# Patient Record
Sex: Female | Born: 1955 | Race: White | Hispanic: No | Smoking: Current every day smoker
Health system: Southern US, Community
[De-identification: ages and names within clinical notes are randomized; demographics above are authoritative.]

## PROBLEM LIST (undated history)

## (undated) DIAGNOSIS — I10 Essential (primary) hypertension: Secondary | ICD-10-CM

## (undated) DIAGNOSIS — E7800 Pure hypercholesterolemia, unspecified: Secondary | ICD-10-CM

## (undated) HISTORY — PX: FRACTURE SURGERY: SHX138

---

## 2011-08-15 ENCOUNTER — Encounter: Payer: Self-pay | Admitting: *Deleted

## 2011-08-15 ENCOUNTER — Other Ambulatory Visit: Payer: Self-pay

## 2011-08-15 ENCOUNTER — Emergency Department (HOSPITAL_COMMUNITY)
Admission: EM | Admit: 2011-08-15 | Discharge: 2011-08-15 | Disposition: A | Discharge status: Home / Self Care | Payer: Self-pay | Attending: Emergency Medicine | Admitting: Emergency Medicine

## 2011-08-15 DIAGNOSIS — R51 Headache: Secondary | ICD-10-CM | POA: Insufficient documentation

## 2011-08-15 DIAGNOSIS — E878 Other disorders of electrolyte and fluid balance, not elsewhere classified: Secondary | ICD-10-CM | POA: Insufficient documentation

## 2011-08-15 DIAGNOSIS — F17200 Nicotine dependence, unspecified, uncomplicated: Secondary | ICD-10-CM | POA: Insufficient documentation

## 2011-08-15 DIAGNOSIS — I10 Essential (primary) hypertension: Secondary | ICD-10-CM | POA: Insufficient documentation

## 2011-08-15 DIAGNOSIS — E871 Hypo-osmolality and hyponatremia: Secondary | ICD-10-CM | POA: Insufficient documentation

## 2011-08-15 DIAGNOSIS — I498 Other specified cardiac arrhythmias: Secondary | ICD-10-CM | POA: Insufficient documentation

## 2011-08-15 LAB — BASIC METABOLIC PANEL
BUN: 5 mg/dL — ABNORMAL LOW (ref 6–23)
Chloride: 88 mEq/L — ABNORMAL LOW (ref 96–112)
GFR calc Af Amer: >60 mL/min (ref >60)
Glucose, Bld: 95 mg/dL (ref 70–99)
Potassium: 4 mEq/L (ref 3.5–5.1)

## 2011-08-15 LAB — URINALYSIS, ROUTINE W REFLEX MICROSCOPIC
Bilirubin Urine: NEGATIVE
Hgb urine dipstick: NEGATIVE
Specific Gravity, Urine: <1.005 — ABNORMAL LOW (ref 1.005–1.030)
pH: 6 (ref 5.0–8.0)

## 2011-08-15 MED ORDER — LISINOPRIL 10 MG PO TABS
10.0000 mg | ORAL_TABLET | Freq: Once | ORAL | Status: AC
  Administered 2011-08-15: 10 mg via ORAL
  Filled 2011-08-15: qty 1

## 2011-08-15 MED ORDER — LORAZEPAM 1 MG PO TABS
1.0000 mg | ORAL_TABLET | Freq: Once | ORAL | Status: AC
  Administered 2011-08-15: 1 mg via ORAL
  Filled 2011-08-15: qty 1

## 2011-08-15 MED ORDER — LISINOPRIL 10 MG PO TABS: 10.0000 mg | ORAL_TABLET | Freq: Every day | ORAL | Status: DC

## 2011-08-15 MED ORDER — SODIUM CHLORIDE 0.9 % IV BOLUS (SEPSIS)
1000.0000 mL | Freq: Once | INTRAVENOUS | Status: AC
  Administered 2011-08-15: 1000 mL via INTRAVENOUS

## 2011-08-15 NOTE — ED Notes (Signed)
Pt states she had a headache last night and has been under a lot of pressure; pt states she had a family member take her BP and it was 186/96, pt states she then took 1/2 of a xanax and her BP came down to 183/93

## 2011-08-15 NOTE — ED Provider Notes (Signed)
Scribed for Dayton Bailiff, MD, the patient was seen in room APA15/APA15. This chart was scribed by [employer]. The patient's care started at 15:49  CSN: [id number] Arrival date & time: 08/15/2011  3:48 PM  Chief Complaint  Patient presents with  . Hypertension   HPI Cassandra Salazar is a 55 y.o. female who presents to the Emergency Department complaining of Hypertension. Complains of being under a lot of pressure in life. Patient denies Chest pain, SOB, or visual changes but reports a headache. She denies ever being on blood pressure medicine. Per nurse notes, patient states having a BP of 186/96 that was alleviated to 183/93 after taking 1/2 of a xanax. Patient is nervous. Reports a alcohol and tobacco use. No PCP.  History reviewed. No pertinent past medical history.  History reviewed. No pertinent past surgical history.  History reviewed. No pertinent family history.  History  Substance Use Topics  . Smoking status: Current Everyday Smoker  . Smokeless tobacco: Not on file  . Alcohol Use: Yes     beer daily    OB History    Grav Para Term Preterm Abortions TAB SAB Ect Mult Living                  Review of Systems  Eyes: Negative for visual disturbance.  Cardiovascular: Negative for chest pain.  Neurological: Positive for headaches.  All other systems reviewed and are negative.    Allergies  Ampicillin  Home Medications   Current Outpatient Rx  Name Route Sig Dispense Refill  . ASPIRIN 325 MG PO TABS Oral Take 650 mg by mouth as needed. Headache pain     . LISINOPRIL 10 MG PO TABS Oral Take 1 tablet (10 mg total) by mouth daily. 30 tablet 0    BP 207/95  Pulse 105  Temp(Src) 98.7 F (37.1 C) (Oral)  Resp 20  Ht 5\' 5"  (1.651 m)  Wt 154 lb (69.854 kg)  BMI 25.63 kg/m2  SpO2 98%  Physical Exam  Nursing note and vitals reviewed. Constitutional: She is oriented to person, place, and time. She appears well-developed.       Awake, alert, nontoxic  appearance.  HENT:  Head: Atraumatic.  Eyes: Conjunctivae are normal. Pupils are equal, round, and reactive to light. Right eye exhibits no discharge. Left eye exhibits no discharge.  Neck: Neck supple. No tracheal deviation present.  Cardiovascular: Regular rhythm and normal heart sounds.        Patient was a little tachycardic because she was nervous upon examination  Pulmonary/Chest: Effort normal. No respiratory distress. She exhibits no tenderness.  Abdominal: Soft. Bowel sounds are normal. There is no tenderness. There is no rebound.  Musculoskeletal: Normal range of motion. She exhibits no tenderness.       Baseline ROM, no obvious new focal weakness.  Lymphadenopathy:    She has no cervical adenopathy.  Neurological: She is alert and oriented to person, place, and time. No cranial nerve deficit.       Mental status and motor strength appears baseline for patient and situation.  Skin: Skin is warm and dry. No rash noted. She is not diaphoretic.  Psychiatric: She has a normal mood and affect.       Patient is nervous    ED Course  Procedures   OTHER DATA REVIEWED: Nursing notes, vital signs, and past medical records reviewed.    DIAGNOSTIC STUDIES: Oxygen Saturation is 98% on room air, normal by my interpretation.  LABS / RADIOLOGY:  Results for orders placed during the hospital encounter of 08/15/11  URINALYSIS, ROUTINE W REFLEX MICROSCOPIC      Component Value Range   Color, Urine YELLOW  YELLOW    Appearance CLEAR  CLEAR    Specific Gravity, Urine <1.005 (*) 1.005 - 1.030    pH 6.0  5.0 - 8.0    Glucose, UA NEGATIVE  NEGATIVE (mg/dL)   Hgb urine dipstick NEGATIVE  NEGATIVE    Bilirubin Urine NEGATIVE  NEGATIVE    Ketones, ur 15 (*) NEGATIVE (mg/dL)   Protein, ur NEGATIVE  NEGATIVE (mg/dL)   Urobilinogen, UA 0.2  0.0 - 1.0 (mg/dL)   Nitrite NEGATIVE  NEGATIVE    Leukocytes, UA NEGATIVE  NEGATIVE   BASIC METABOLIC PANEL      Component Value Range   Sodium 127  (*) 135 - 145 (mEq/L)   Potassium 4.0  3.5 - 5.1 (mEq/L)   Chloride 88 (*) 96 - 112 (mEq/L)   CO2 26  19 - 32 (mEq/L)   Glucose, Bld 95  70 - 99 (mg/dL)   BUN 5 (*) 6 - 23 (mg/dL)   Creatinine, Ser 4.09  0.50 - 1.10 (mg/dL)   Calcium 9.6  8.4 - 81.1 (mg/dL)   GFR calc non Af Amer >60  >60 (mL/min)   GFR calc Af Amer >60  >60 (mL/min)     ED COURSE / COORDINATION OF CARE: 15:55 - EDMD examined patient. Patient is anxious and nervous. The following orders were placed Orders Placed This Encounter  Procedures  . Urinalysis with microscopic  . Basic metabolic panel  . EKG test     Date: 08/15/2011  Rate: 111  Rhythm: sinus tachycardia  QRS Axis: indeterminate  Intervals: normal  ST/T Wave abnormalities: ST depressions diffusely  Conduction Disutrbances:none  Narrative Interpretation: No ischemic change  Old EKG Reviewed: none available  MDM: Patient with asymptomatic hypertension. She has no previous treatment for hypertension. She is under a lot of stress. She has no chest pain, shortness of breath, vision changes, headache the time of arrival. She had mild headache last evening. I performed a screening labs. Her urine was relatively unremarkable her crit was normal. Incidental finding of hyponatremia and hypochloremia was identified. Since she is asymptomatic I administered a liter of IV fluids and encouraged her to continue hydration with electrolyte solutions at home. I do not feel this warrants admission at this time as she is asymptomatic. She received a dose of lisinopril in the emergency department her heart rate normalized. She has appointment for followup on October 15 and was encouraged to attend that appointment. I explained her that the emergency department is not the best place for chronic blood pressure management. I will start her on lisinopril.  IMPRESSION: Diagnoses that have been ruled out:  Diagnoses that are still under consideration:  Final diagnoses:    Hypertension  Hyponatremia  Hypochloremia    PLAN:  Home  The patient is to return the emergency department if there is any worsening of symptoms. I have reviewed the discharge instructions with the patient  CONDITION ON DISCHARGE: Stable  MEDICATIONS GIVEN IN THE E.D.  Medications  aspirin 325 MG tablet (not administered)  sodium chloride 0.9 % bolus 1,000 mL (1000 mL Intravenous Not Given 08/15/11 1739)  lisinopril (PRINIVIL,ZESTRIL) 10 MG tablet (not administered)  LORazepam (ATIVAN) tablet 1 mg (1 mg Oral Given 08/15/11 1606)  lisinopril (PRINIVIL,ZESTRIL) tablet 10 mg (10 mg Oral Given 08/15/11 1737)  DISCHARGE MEDICATIONS: New Prescriptions   LISINOPRIL (PRINIVIL,ZESTRIL) 10 MG TABLET    Take 1 tablet (10 mg total) by mouth daily.    SCRIBE ATTESTATION: I personally performed the services described in this documentation, which was scribed in my presence. The recorded information has been reviewed and considered.     Dayton Bailiff, MD 08/15/11 1800

## 2011-10-01 ENCOUNTER — Emergency Department (HOSPITAL_COMMUNITY): Payer: Self-pay

## 2011-10-01 ENCOUNTER — Other Ambulatory Visit: Payer: Self-pay

## 2011-10-01 ENCOUNTER — Inpatient Hospital Stay (HOSPITAL_COMMUNITY)
Admission: EM | Admit: 2011-10-01 | Discharge: 2011-10-02 | DRG: 101 | Disposition: A | Discharge status: Home / Self Care | Payer: Self-pay | Attending: Internal Medicine | Admitting: Internal Medicine

## 2011-10-01 ENCOUNTER — Encounter (HOSPITAL_COMMUNITY): Payer: Self-pay | Admitting: Emergency Medicine

## 2011-10-01 DIAGNOSIS — Z720 Tobacco use: Secondary | ICD-10-CM

## 2011-10-01 DIAGNOSIS — F17200 Nicotine dependence, unspecified, uncomplicated: Secondary | ICD-10-CM | POA: Diagnosis present

## 2011-10-01 DIAGNOSIS — R569 Unspecified convulsions: Principal | ICD-10-CM

## 2011-10-01 DIAGNOSIS — F1010 Alcohol abuse, uncomplicated: Secondary | ICD-10-CM | POA: Diagnosis present

## 2011-10-01 DIAGNOSIS — I10 Essential (primary) hypertension: Secondary | ICD-10-CM

## 2011-10-01 DIAGNOSIS — E876 Hypokalemia: Secondary | ICD-10-CM

## 2011-10-01 DIAGNOSIS — E871 Hypo-osmolality and hyponatremia: Secondary | ICD-10-CM | POA: Diagnosis present

## 2011-10-01 DIAGNOSIS — R55 Syncope and collapse: Secondary | ICD-10-CM

## 2011-10-01 HISTORY — DX: Essential (primary) hypertension: I10

## 2011-10-01 LAB — DIFFERENTIAL
Eosinophils Absolute: 0 10*3/uL (ref 0.0–0.7)
Lymphocytes Relative: 21 % (ref 12–46)
Lymphs Abs: 2.1 10*3/uL (ref 0.7–4.0)
Monocytes Relative: 8 % (ref 3–12)
Neutrophils Relative %: 70 % (ref 43–77)

## 2011-10-01 LAB — COMPREHENSIVE METABOLIC PANEL
Alkaline Phosphatase: 103 U/L (ref 39–117)
BUN: 3 mg/dL — ABNORMAL LOW (ref 6–23)
CO2: 30 mEq/L (ref 19–32)
Chloride: 82 mEq/L — ABNORMAL LOW (ref 96–112)
GFR calc Af Amer: >90 mL/min (ref >90)
GFR calc non Af Amer: >90 mL/min (ref >90)
Glucose, Bld: 120 mg/dL — ABNORMAL HIGH (ref 70–99)
Potassium: 2.9 mEq/L — ABNORMAL LOW (ref 3.5–5.1)
Total Bilirubin: 0.4 mg/dL (ref 0.3–1.2)
Total Protein: 7.3 g/dL (ref 6.0–8.3)

## 2011-10-01 LAB — URINALYSIS, ROUTINE W REFLEX MICROSCOPIC
Bilirubin Urine: NEGATIVE
Hgb urine dipstick: NEGATIVE
Nitrite: NEGATIVE
Specific Gravity, Urine: 1.015 (ref 1.005–1.030)
pH: 6 (ref 5.0–8.0)

## 2011-10-01 LAB — ETHANOL: Alcohol, Ethyl (B): <11 mg/dL (ref 0–11)

## 2011-10-01 LAB — CBC
Hemoglobin: 14.2 g/dL (ref 12.0–15.0)
MCH: 32.7 pg (ref 26.0–34.0)
RBC: 4.34 MIL/uL (ref 3.87–5.11)

## 2011-10-01 MED ORDER — ASPIRIN EC 81 MG PO TBEC
81.0000 mg | DELAYED_RELEASE_TABLET | Freq: Every day | ORAL | Status: DC
  Administered 2011-10-02: 81 mg via ORAL
  Filled 2011-10-01: qty 1

## 2011-10-01 MED ORDER — VITAMIN B-1 100 MG PO TABS
100.0000 mg | ORAL_TABLET | Freq: Every day | ORAL | Status: DC
  Administered 2011-10-02: 100 mg via ORAL
  Filled 2011-10-01: qty 1

## 2011-10-01 MED ORDER — HYDROMORPHONE HCL PF 1 MG/ML IJ SOLN: 1.0000 mg | INTRAMUSCULAR | Status: DC | PRN

## 2011-10-01 MED ORDER — ONDANSETRON HCL 4 MG PO TABS: 4.0000 mg | ORAL_TABLET | Freq: Four times a day (QID) | ORAL | Status: DC | PRN

## 2011-10-01 MED ORDER — FLEET ENEMA 7-19 GM/118ML RE ENEM: 1.0000 | ENEMA | RECTAL | Status: DC | PRN

## 2011-10-01 MED ORDER — POTASSIUM CHLORIDE IN NACL 20-0.9 MEQ/L-% IV SOLN
INTRAVENOUS | Status: DC
  Administered 2011-10-02: 01:00:00 via INTRAVENOUS

## 2011-10-01 MED ORDER — ENOXAPARIN SODIUM 40 MG/0.4ML ~~LOC~~ SOLN
40.0000 mg | Freq: Every day | SUBCUTANEOUS | Status: DC
  Administered 2011-10-02: 40 mg via SUBCUTANEOUS
  Filled 2011-10-01: qty 0.4

## 2011-10-01 MED ORDER — FOLIC ACID 1 MG PO TABS
1.0000 mg | ORAL_TABLET | Freq: Every day | ORAL | Status: DC
  Administered 2011-10-02: 1 mg via ORAL
  Filled 2011-10-01: qty 1

## 2011-10-01 MED ORDER — HYDROMORPHONE HCL PF 1 MG/ML IJ SOLN
0.5000 mg | INTRAMUSCULAR | Status: DC | PRN
  Filled 2011-10-01: qty 1

## 2011-10-01 MED ORDER — TRAZODONE HCL 50 MG PO TABS: 25.0000 mg | ORAL_TABLET | Freq: Every evening | ORAL | Status: DC | PRN

## 2011-10-01 MED ORDER — LORAZEPAM 2 MG/ML IJ SOLN: 0.0000 mg | Freq: Two times a day (BID) | INTRAMUSCULAR | Status: DC

## 2011-10-01 MED ORDER — LORAZEPAM 1 MG PO TABS: 1.0000 mg | ORAL_TABLET | Freq: Four times a day (QID) | ORAL | Status: DC | PRN

## 2011-10-01 MED ORDER — BISACODYL 10 MG RE SUPP: 10.0000 mg | RECTAL | Status: DC | PRN

## 2011-10-01 MED ORDER — ONDANSETRON HCL 4 MG/2ML IJ SOLN: 4.0000 mg | Freq: Four times a day (QID) | INTRAMUSCULAR | Status: DC | PRN

## 2011-10-01 MED ORDER — SODIUM CHLORIDE 0.9 % IV SOLN
INTRAVENOUS | Status: AC
  Administered 2011-10-01: 20:00:00 via INTRAVENOUS

## 2011-10-01 MED ORDER — THIAMINE HCL 100 MG/ML IJ SOLN
Freq: Once | INTRAVENOUS | Status: AC
  Administered 2011-10-02: 01:00:00 via INTRAVENOUS
  Filled 2011-10-01: qty 1000

## 2011-10-01 MED ORDER — SODIUM CHLORIDE 0.9 % IV SOLN
999.0000 mL | Freq: Once | INTRAVENOUS | Status: AC
  Administered 2011-10-01: 1000 mL via INTRAVENOUS

## 2011-10-01 MED ORDER — ONDANSETRON HCL 4 MG/2ML IJ SOLN: 4.0000 mg | Freq: Three times a day (TID) | INTRAMUSCULAR | Status: DC | PRN

## 2011-10-01 MED ORDER — LORAZEPAM 2 MG/ML IJ SOLN: 1.0000 mg | Freq: Four times a day (QID) | INTRAMUSCULAR | Status: DC | PRN

## 2011-10-01 MED ORDER — DOCUSATE SODIUM 100 MG PO CAPS: 100.0000 mg | ORAL_CAPSULE | Freq: Two times a day (BID) | ORAL | Status: DC | PRN

## 2011-10-01 MED ORDER — ACETAMINOPHEN 325 MG PO TABS: 650.0000 mg | ORAL_TABLET | Freq: Four times a day (QID) | ORAL | Status: DC | PRN

## 2011-10-01 MED ORDER — THERA M PLUS PO TABS
1.0000 | ORAL_TABLET | Freq: Every day | ORAL | Status: DC
  Administered 2011-10-02: 1 via ORAL
  Filled 2011-10-01: qty 1

## 2011-10-01 MED ORDER — ACETAMINOPHEN 650 MG RE SUPP: 650.0000 mg | Freq: Four times a day (QID) | RECTAL | Status: DC | PRN

## 2011-10-01 MED ORDER — POTASSIUM CHLORIDE 10 MEQ/100ML IV SOLN
10.0000 meq | INTRAVENOUS | Status: AC
  Administered 2011-10-01 (×2): 10 meq via INTRAVENOUS
  Filled 2011-10-01 (×2): qty 100

## 2011-10-01 MED ORDER — THIAMINE HCL 100 MG/ML IJ SOLN: 100.0000 mg | Freq: Every day | INTRAMUSCULAR | Status: DC

## 2011-10-01 MED ORDER — LORAZEPAM 2 MG/ML IJ SOLN: 0.0000 mg | Freq: Four times a day (QID) | INTRAMUSCULAR | Status: DC

## 2011-10-01 MED ORDER — NICOTINE 21 MG/24HR TD PT24
21.0000 mg | MEDICATED_PATCH | Freq: Every day | TRANSDERMAL | Status: DC
  Administered 2011-10-02: 21 mg via TRANSDERMAL
  Filled 2011-10-01: qty 1

## 2011-10-01 NOTE — ED Notes (Signed)
Patient arrives via EMS from home with c/o syncopal episode with seizure like activity per family. Family describes activity as full body and lasting for several minutes. Patient was post-ictal upon EMS arrival. Patient not sure what happened, but reports she got lightheaded and nauseous before she passed out.

## 2011-10-01 NOTE — ED Provider Notes (Addendum)
History     CSN: [id number] Arrival date & time: 10/01/2011  4:26 PM   First MD Initiated Contact with Patient 10/01/11 1629      Chief Complaint  Patient presents with  . Loss of Consciousness    (Consider location/radiation/quality/duration/timing/severity/associated sxs/prior treatment) HPI The patient presents immediately following an episode of altered consciousness. Per report, the patient lost consciousness, and was shaking for several moments, before slowly gaining consciousness. The patient herself notes that she had a prodromal sensation of lightheadedness, then a period of amnesia, and she recalls awakening with slight disorientation. On EGD presentation, the patient is asymptomatic, he notes that prior to this episode she had been in her usual state of health. No recent health changes, but she notes increased home stress, the passing of her father-in-law. She also notes that she switched from lisinopril to atenolol-chlorthalidone approximately one month ago. No fever, no chills, no nausea, no current disorientation, no pain, no dyspnea, no chest pain Past Medical History  Diagnosis Date  . Hypertension     History reviewed. No pertinent past surgical history.  No family history on file.  History  Substance Use Topics  . Smoking status: Current Everyday Smoker -- 1.0 packs/day  . Smokeless tobacco: Not on file  . Alcohol Use: Yes     beer daily    OB History    Grav Para Term Preterm Abortions TAB SAB Ect Mult Living                  Review of Systems  Constitutional: Negative.   HENT: Negative.   Eyes: Negative.   Respiratory: Negative.   Cardiovascular: Negative.   Gastrointestinal: Negative.   Genitourinary: Negative.   Musculoskeletal: Negative.   Neurological:       1 prior, similar episode though less pronounced approximately one month ago.  Hematological: Negative.   Psychiatric/Behavioral: Negative.     Allergies  Ampicillin and Mango  flavor  Home Medications   Current Outpatient Rx  Name Route Sig Dispense Refill  . ASPIRIN 325 MG PO TABS Oral Take 650 mg by mouth as needed. Headache pain     . ATENOLOL-CHLORTHALIDONE 50-25 MG PO TABS Oral Take 1 tablet by mouth daily.        BP 156/78  Pulse 68  Temp(Src) 98.6 F (37 C) (Oral)  Resp 18  Ht 5\' 3"  (1.6 m)  Wt 154 lb (69.854 kg)  BMI 27.28 kg/m2  SpO2 96%  Physical Exam  Nursing note and vitals reviewed. Constitutional: She is oriented to person, place, and time. No distress.  HENT:  Head: Normocephalic and atraumatic.  Eyes: Conjunctivae and EOM are normal.  Cardiovascular: Normal rate and regular rhythm.   Pulmonary/Chest: Effort normal. No stridor. No respiratory distress.  Abdominal: She exhibits no distension.  Musculoskeletal: She exhibits no edema.  Neurological: She is alert and oriented to person, place, and time. No cranial nerve deficit.  Skin: Skin is warm and dry.  Psychiatric: She has a normal mood and affect.    ED Course  Procedures (including critical care time)  Labs Reviewed  COMPREHENSIVE METABOLIC PANEL - Abnormal; Notable for the following:    Sodium 120 (*)    Potassium 2.9 (*)    Chloride 82 (*)    Glucose, Bld 120 (*)    BUN 3 (*)    All other components within normal limits  URINALYSIS, ROUTINE W REFLEX MICROSCOPIC  CBC  DIFFERENTIAL   Dg Chest 2 View  10/01/2011  *  RADIOLOGY REPORT*  Clinical Data: Syncope.  Possible seizure.  Hypertension.  Smoking history.  CHEST - 2 VIEW  Comparison: None.  Findings: Artifact overlies the chest.  Heart size is normal. There is calcification of the thoracic aorta.  The lung shows slightly prominent interstitial markings there are likely chronic. No evidence of active infiltrate, mass, effusion or collapse. There are compression fractures of two adjacent mid thoracic vertebral bodies with loss of height of about 50%.  The age is indeterminate.  IMPRESSION: No active cardiopulmonary  disease.  Chronic appearing interstitial lung markings.  Two adjacent mid thoracic compression fractures, age indeterminate.  Original Report Authenticated By: Thomasenia Sales, M.D.   Ct Head Wo Contrast  10/01/2011  *RADIOLOGY REPORT*  Clinical Data: Syncopal episode.  Possible seizure.  CT HEAD WITHOUT CONTRAST  Technique:  Contiguous axial images were obtained from the base of the skull through the vertex without contrast.  Comparison: None.  Findings: The brain has a normal appearance without evidence of malformation, atrophy, old or acute infarction, mass lesion, hemorrhage, hydrocephalus or extra-axial collection. There are mild chronic small vessel changes affecting the hemispheric deep white matter.  There is either a small old lacunar infarction or a dilated perivascular space at the base of the brain on the right. There is atherosclerotic calcification of the major vessels at the base of the brain.  No skull fracture.  Sinuses are clear.  IMPRESSION: No acute finding.  Mild chronic small vessel disease of the deep white matter.  Atherosclerotic calcification of the major vessels at the base the brain.  Original Report Authenticated By: Thomasenia Sales, M.D.     No diagnosis found.   Date: 10/01/2011  Rate:80  Rhythm: normal sinus rhythm  QRS Axis: normal  Intervals: normal  ST/T Wave abnormalities: nonspecific ST changes and nonspecific T wave changes  Conduction Disutrbances:incomplete RBBB  Narrative Interpretation:   Old EKG Reviewed: unchanged    MDM  This generally well-appearing 55 year old female now presents following a seizure versus syncope episode. On exam the patient is in no distress, has no notable physical exam findings. The patient's laboratory evaluation is notable for hyponatremia, hypokalemia.  CT and x-rays do not demonstrate acute changes.  Given the patient's recent switch from lisinopril to atenolol-chlorthalidone, medication effects are a possibility. Following  presentation the patient received IV fluids, and these will be continued. The patient will be admitted to the medicine service, under the care of Dr. Orvan Falconer.        Gerhard Munch, MD 10/01/11 1920  Gerhard Munch, MD 10/01/11 1943  Gerhard Munch, MD 11/26/11 [id number]

## 2011-10-01 NOTE — ED Notes (Signed)
Michaele Amundson ( husband) [phone] cell

## 2011-10-01 NOTE — H&P (Signed)
PCP:   No primary provider on file.   Chief Complaint:  Syncope this afternoon  HPI: Cassandra Salazar is an 55 y.o. Caucasian female.   Recently relocated to the  [place] area from [place] 2 months ago. Other than hypertension during pregnancy has had normal blood pressures, but recently felt her blood pressure was elevated had it checked by a relative and then came to the emergency room in September and was started on lisinopril. 2-3 weeks ago she visited the public health Department and the lisinopril was replaced by an atenolol 50 mg/ chlorthalidone 25 mg combination. Additionally, patient self reports drinking 6-8 beers daily for the past 30 years.  Patient self-reports a syncopal episode in her bathtub 1-2 months ago the details surrounding this are unclear; she woke up with a busted lip; is unclear how long she was unconscious for; he was alone at home.   Today patient had an episode early in the morning while sitting at her computer feeling dizzy and weird, she got up from her computer and laid down and the feeling eventually pass. Later in the afternoon with her husband present she was watching television and her husband reports that she suddenly shouted out assumed a stiffened extended posture been off her body began to jerk and she was foaming at the mouth for about 5 minutes. She then became unconscious for about 10 minutes, until the ambulance arrived and took her away. Patient remembers watching television, remembers feeling somewhat strange, but doesn't remember anything after that until she came around, saw EMS personnel, and was disoriented in time and place. She did not have any tongue biting she was not incontinent of urine. She has no known previous history of seizures she has no family history of seizures. He has an extensive family history of alcohol addiction.  She denies fever headache or neck stiffness.  Past Medical History  Diagnosis Date  . Hypertension      History reviewed. No pertinent past surgical history.  Medications:  HOME MEDS: Prior to Admission medications   Medication Sig Start Date End Date Taking? Authorizing Provider  aspirin 325 MG tablet Take 650 mg by mouth as needed. Headache pain    Yes Historical Provider, MD  atenolol-chlorthalidone (TENORETIC) 50-25 MG per tablet Take 1 tablet by mouth daily.     Yes Historical Provider, MD     Allergies:  Allergies  Allergen Reactions  . Ampicillin Rash  . Mango Flavor Rash    Social History:   reports that she has been smoking.  She does not have any smokeless tobacco history on file. She reports that she drinks about 20 - 25 ounces of alcohol per week. She reports that she does not use illicit drugs.  Family History: Family History  Problem Relation Age of Onset  . Hypertension Father   . Hypertension Brother   . Diabetes Mother   . Kidney failure Mother     ESRD from DM  . Diabetes Brother   . Diabetes Brother    alcohol abuse in her father and 4 brothers  Rewiew of Systems:  The patient denies anorexia, fever, weight loss,, vision loss, decreased hearing, hoarseness, chest pain, dyspnea on exertion, peripheral edema, balance deficits, hemoptysis, abdominal pain, melena, hematochezia, severe indigestion/heartburn, hematuria, incontinence, genital sores, muscle weakness, suspicious skin lesions, transient blindness, difficulty walking, depression, unusual weight change, abnormal bleeding, enlarged lymph nodes, angioedema, and breast masses.  Physical Exam: Filed Vitals:   10/01/11 1842 10/01/11 1843 10/01/11 1920  10/01/11 2038  BP: 156/78 156/78 183/87 129/69  Pulse: 69 68 71 70  Temp:      TempSrc:      Resp: 18 18 16 16   Height:      Weight:      SpO2: 96% 96% 96% 96%   Blood pressure 129/69, pulse 70, temperature 98.6 F (37 C), temperature source Oral, resp. rate 16, height 5\' 3"  (1.6 m), weight 69.854 kg (154 lb), SpO2 96.00%.  GEN: Pleasant  middle-aged lady in lying in the stretcher in no acute distress; cooperative with exam PSYCH: alert and oriented x4; does not appear anxious does not appear depressed; affect is normal HEENT: Mucous membranes pink and anicteric; PERRLA; EOM intact; no cervical lymphadenopathy nor thyromegaly or carotid bruit; no JVD; Breasts:: Not examined CHEST WALL: No tenderness CHEST: Normal respiration, clear to auscultation bilaterally HEART: Regular rate and rhythm; no murmurs rubs or gallops BACK: No kyphosis or scoliosis; no CVA tenderness ABDOMEN: Obese, soft non-tender; no masses, no organomegaly, normal abdominal bowel sounds; no pannus; no intertriginous candida. Rectal Exam: Not done EXTREMITIES: age-appropriate arthropathy of the hands and knees; no edema; no ulcerations. Genitalia: not examined PULSES: 2+ and symmetric SKIN: Normal hydration no rash or ulceration CNS: Cranial nerves 2-12 grossly intact no focal neurologic deficit   Labs & Imaging Results for orders placed during the hospital encounter of 10/01/11 (from the past 48 hour(s))  URINALYSIS, ROUTINE W REFLEX MICROSCOPIC     Status: Normal   Collection Time   10/01/11  4:48 PM      Component Value Range Comment   Color, Urine YELLOW  YELLOW     Appearance CLEAR  CLEAR     Specific Gravity, Urine 1.015  1.005 - 1.030     pH 6.0  5.0 - 8.0     Glucose, UA NEGATIVE  NEGATIVE (mg/dL)    Hgb urine dipstick NEGATIVE  NEGATIVE     Bilirubin Urine NEGATIVE  NEGATIVE     Ketones, ur NEGATIVE  NEGATIVE (mg/dL)    Protein, ur NEGATIVE  NEGATIVE (mg/dL)    Urobilinogen, UA 0.2  0.0 - 1.0 (mg/dL)    Nitrite NEGATIVE  NEGATIVE     Leukocytes, UA NEGATIVE  NEGATIVE  MICROSCOPIC NOT DONE ON URINES WITH NEGATIVE PROTEIN, BLOOD, LEUKOCYTES, NITRITE, OR GLUCOSE <1000 mg/dL.  CBC     Status: Normal   Collection Time   10/01/11  5:18 PM      Component Value Range Comment   WBC 9.8  4.0 - 10.5 (K/uL)    RBC 4.34  3.87 - 5.11 (MIL/uL)     Hemoglobin 14.2  12.0 - 15.0 (g/dL)    HCT 40.9  81.1 - 91.4 (%)    MCV 92.4  78.0 - 100.0 (fL)    MCH 32.7  26.0 - 34.0 (pg)    MCHC 35.4  30.0 - 36.0 (g/dL)    RDW 78.2  95.6 - 21.3 (%)    Platelets 228  150 - 400 (K/uL)   DIFFERENTIAL     Status: Normal   Collection Time   10/01/11  5:18 PM      Component Value Range Comment   Neutrophils Relative 70  43 - 77 (%)    Neutro Abs 6.9  1.7 - 7.7 (K/uL)    Lymphocytes Relative 21  12 - 46 (%)    Lymphs Abs 2.1  0.7 - 4.0 (K/uL)    Monocytes Relative 8  3 - 12 (%)  Monocytes Absolute 0.8  0.1 - 1.0 (K/uL)    Eosinophils Relative 0  0 - 5 (%)    Eosinophils Absolute 0.0  0.0 - 0.7 (K/uL)    Basophils Relative 0  0 - 1 (%)    Basophils Absolute 0.0  0.0 - 0.1 (K/uL)   COMPREHENSIVE METABOLIC PANEL     Status: Abnormal   Collection Time   10/01/11  5:18 PM      Component Value Range Comment   Sodium 120 (*) 135 - 145 (mEq/L)    Potassium 2.9 (*) 3.5 - 5.1 (mEq/L)    Chloride 82 (*) 96 - 112 (mEq/L)    CO2 30  19 - 32 (mEq/L)    Glucose, Bld 120 (*) 70 - 99 (mg/dL)    BUN 3 (*) 6 - 23 (mg/dL)    Creatinine, Ser 1.61  0.50 - 1.10 (mg/dL)    Calcium 9.6  8.4 - 10.5 (mg/dL)    Total Protein 7.3  6.0 - 8.3 (g/dL)    Albumin 3.9  3.5 - 5.2 (g/dL)    AST 22  0 - 37 (U/L)    ALT 20  0 - 35 (U/L)    Alkaline Phosphatase 103  39 - 117 (U/L)    Total Bilirubin 0.4  0.3 - 1.2 (mg/dL)    GFR calc non Af Amer >90  >90 (mL/min)    GFR calc Af Amer >90  >90 (mL/min)    Dg Chest 2 View  10/01/2011  *RADIOLOGY REPORT*  Clinical Data: Syncope.  Possible seizure.  Hypertension.  Smoking history.  CHEST - 2 VIEW  Comparison: None.  Findings: Artifact overlies the chest.  Heart size is normal. There is calcification of the thoracic aorta.  The lung shows slightly prominent interstitial markings there are likely chronic. No evidence of active infiltrate, mass, effusion or collapse. There are compression fractures of two adjacent mid thoracic  vertebral bodies with loss of height of about 50%.  The age is indeterminate.  IMPRESSION: No active cardiopulmonary disease.  Chronic appearing interstitial lung markings.  Two adjacent mid thoracic compression fractures, age indeterminate.  Original Report Authenticated By: Thomasenia Sales, M.D.   Ct Head Wo Contrast  10/01/2011  *RADIOLOGY REPORT*  Clinical Data: Syncopal episode.  Possible seizure.  CT HEAD WITHOUT CONTRAST  Technique:  Contiguous axial images were obtained from the base of the skull through the vertex without contrast.  Comparison: None.  Findings: The brain has a normal appearance without evidence of malformation, atrophy, old or acute infarction, mass lesion, hemorrhage, hydrocephalus or extra-axial collection. There are mild chronic small vessel changes affecting the hemispheric deep white matter.  There is either a small old lacunar infarction or a dilated perivascular space at the base of the brain on the right. There is atherosclerotic calcification of the major vessels at the base of the brain.  No skull fracture.  Sinuses are clear.  IMPRESSION: No acute finding.  Mild chronic small vessel disease of the deep white matter.  Atherosclerotic calcification of the major vessels at the base the brain.  Original Report Authenticated By: Thomasenia Sales, M.D.      Assessment Present on Admission:  .Syncope .Seizure .Hyponatremia .Hypokalemia .HTN (hypertension), benign .Tobacco abuse .Alcohol abuse   Discussion/PLAN: This is likely a seizure disorder triggered by metabolic derangements especially hyponatremia, and to some extent hypokalemia. A metabolic derangements it is likely a result of chlorthalidone use, aggravated by alcohol abuse. We'll keep on seizure precautions  but will not start antiseizure medication;  since her mental status is normal we'll hydrate with normal saline and monitor her serum sodium. Will discontinue chlorthalidone./ We'll replete  potassium Counsel on tobacco and alcohol cessation We'll monitor her blood pressure in hospital, and restart atenolol as necessary.   Other plans as per orders.  Laporshia Hogen 10/01/2011, 8:42 PM

## 2011-10-01 NOTE — Plan of Care (Signed)
Problem: Consults Goal: General Medical Patient Education See Patient Education Module for specific education. Outcome: Progressing Monitoring low potassium levels, neuro checks as appropriate.  Patient A&Ox3, no S&S of seizure activity  Problem: Phase I Progression Outcomes Goal: OOB as tolerated unless otherwise ordered Outcome: Completed/Met Date Met:  10/01/11 Patient steady ambulating from stretcher to bed Goal: Initial discharge plan identified Outcome: Completed/Met Date Met:  10/01/11 To home lives with spouse Goal: Hemodynamically stable Outcome: Progressing Patient on BP med, new med for the last 2 weeks

## 2011-10-01 NOTE — ED Notes (Signed)
Water provided to patient per request with Dr Jeraldine Loots permission.

## 2011-10-01 NOTE — ED Notes (Signed)
Attempted to call report, was told Elease Hashimoto would have to call me back.

## 2011-10-02 LAB — CBC
MCV: 93.2 fL (ref 78.0–100.0)
Platelets: 211 10*3/uL (ref 150–400)
RBC: 3.83 MIL/uL — ABNORMAL LOW (ref 3.87–5.11)
RDW: 11.8 % (ref 11.5–15.5)
WBC: 8 10*3/uL (ref 4.0–10.5)

## 2011-10-02 LAB — BASIC METABOLIC PANEL
CO2: 28 mEq/L (ref 19–32)
Calcium: 8.9 mg/dL (ref 8.4–10.5)
Chloride: 92 mEq/L — ABNORMAL LOW (ref 96–112)
GFR calc Af Amer: >90 mL/min (ref >90)
Sodium: 127 mEq/L — ABNORMAL LOW (ref 135–145)

## 2011-10-02 LAB — MAGNESIUM: Magnesium: 2.2 mg/dL (ref 1.5–2.5)

## 2011-10-02 MED ORDER — AMLODIPINE BESYLATE 5 MG PO TABS: 5.0000 mg | ORAL_TABLET | Freq: Every day | ORAL | Status: DC

## 2011-10-02 MED ORDER — POTASSIUM CHLORIDE CRYS ER 20 MEQ PO TBCR: 40.0000 meq | EXTENDED_RELEASE_TABLET | Freq: Once | ORAL | Status: DC

## 2011-10-02 MED ORDER — M.V.I. ADULT IV INJ
INJECTION | INTRAVENOUS | Status: AC
  Filled 2011-10-02: qty 10

## 2011-10-02 MED ORDER — NICOTINE 21 MG/24HR TD PT24: 21.0000 | MEDICATED_PATCH | Freq: Every day | TRANSDERMAL | Status: AC

## 2011-10-02 MED ORDER — FOLIC ACID 5 MG/ML IJ SOLN
INTRAMUSCULAR | Status: AC
  Filled 2011-10-02: qty 0.2

## 2011-10-02 MED ORDER — POTASSIUM CHLORIDE CRYS ER 20 MEQ PO TBCR
40.0000 meq | EXTENDED_RELEASE_TABLET | ORAL | Status: DC
  Filled 2011-10-02: qty 1

## 2011-10-02 MED ORDER — THIAMINE HCL 100 MG/ML IJ SOLN
INTRAMUSCULAR | Status: AC
  Filled 2011-10-02: qty 2

## 2011-10-02 NOTE — Discharge Summary (Signed)
Physician Discharge Summary  Patient ID: Cassandra Salazar MRN: [id number] DOB/AGE: 1956/02/29 55 y.o.  Admit date: 10/01/2011 Discharge date: 10/02/2011    Discharge Diagnoses:  1. Seizure, related to a combination of hyponatremia and alcoholism. 2. Hyponatremia. 3. Hypertension. 4. Hypokalemia, secondary to medications. 5. Tobacco abuse. 6. Alcohol abuse.   Current Discharge Medication List    START taking these medications   Details  amLODipine (NORVASC) 5 MG tablet Take 1 tablet (5 mg total) by mouth daily. Qty: 30 tablet, Refills: 0    nicotine (NICODERM CQ - DOSED IN MG/24 HOURS) 21 mg/24hr patch Place 21 patches onto the skin daily. Qty: 28 patch, Refills: 0      CONTINUE these medications which have NOT CHANGED   Details  aspirin 325 MG tablet Take 650 mg by mouth as needed. Headache pain       STOP taking these medications     atenolol-chlorthalidone (TENORETIC) 50-25 MG per tablet         Discharged Condition: Stable and improved.    Consults: None.  Significant Diagnostic Studies: Dg Chest 2 View  10/01/2011  *RADIOLOGY REPORT*  Clinical Data: Syncope.  Possible seizure.  Hypertension.  Smoking history.  CHEST - 2 VIEW  Comparison: None.  Findings: Artifact overlies the chest.  Heart size is normal. There is calcification of the thoracic aorta.  The lung shows slightly prominent interstitial markings there are likely chronic. No evidence of active infiltrate, mass, effusion or collapse. There are compression fractures of two adjacent mid thoracic vertebral bodies with loss of height of about 50%.  The age is indeterminate.  IMPRESSION: No active cardiopulmonary disease.  Chronic appearing interstitial lung markings.  Two adjacent mid thoracic compression fractures, age indeterminate.  Original Report Authenticated By: Thomasenia Sales, M.D.   Ct Head Wo Contrast  10/01/2011  *RADIOLOGY REPORT*  Clinical Data: Syncopal episode.  Possible seizure.  CT HEAD  WITHOUT CONTRAST  Technique:  Contiguous axial images were obtained from the base of the skull through the vertex without contrast.  Comparison: None.  Findings: The brain has a normal appearance without evidence of malformation, atrophy, old or acute infarction, mass lesion, hemorrhage, hydrocephalus or extra-axial collection. There are mild chronic small vessel changes affecting the hemispheric deep white matter.  There is either a small old lacunar infarction or a dilated perivascular space at the base of the brain on the right. There is atherosclerotic calcification of the major vessels at the base of the brain.  No skull fracture.  Sinuses are clear.  IMPRESSION: No acute finding.  Mild chronic small vessel disease of the deep white matter.  Atherosclerotic calcification of the major vessels at the base the brain.  Original Report Authenticated By: Thomasenia Sales, M.D.    Lab Results: Results for orders placed during the hospital encounter of 10/01/11 (from the past 48 hour(s))  URINALYSIS, ROUTINE W REFLEX MICROSCOPIC     Status: Normal   Collection Time   10/01/11  4:48 PM      Component Value Range Comment   Color, Urine YELLOW  YELLOW     Appearance CLEAR  CLEAR     Specific Gravity, Urine 1.015  1.005 - 1.030     pH 6.0  5.0 - 8.0     Glucose, UA NEGATIVE  NEGATIVE (mg/dL)    Hgb urine dipstick NEGATIVE  NEGATIVE     Bilirubin Urine NEGATIVE  NEGATIVE     Ketones, ur NEGATIVE  NEGATIVE (mg/dL)  Protein, ur NEGATIVE  NEGATIVE (mg/dL)    Urobilinogen, UA 0.2  0.0 - 1.0 (mg/dL)    Nitrite NEGATIVE  NEGATIVE     Leukocytes, UA NEGATIVE  NEGATIVE  MICROSCOPIC NOT DONE ON URINES WITH NEGATIVE PROTEIN, BLOOD, LEUKOCYTES, NITRITE, OR GLUCOSE <1000 mg/dL.  CBC     Status: Normal   Collection Time   10/01/11  5:18 PM      Component Value Range Comment   WBC 9.8  4.0 - 10.5 (K/uL)    RBC 4.34  3.87 - 5.11 (MIL/uL)    Hemoglobin 14.2  12.0 - 15.0 (g/dL)    HCT 16.1  09.6 - 04.5 (%)     MCV 92.4  78.0 - 100.0 (fL)    MCH 32.7  26.0 - 34.0 (pg)    MCHC 35.4  30.0 - 36.0 (g/dL)    RDW 40.9  81.1 - 91.4 (%)    Platelets 228  150 - 400 (K/uL)   DIFFERENTIAL     Status: Normal   Collection Time   10/01/11  5:18 PM      Component Value Range Comment   Neutrophils Relative 70  43 - 77 (%)    Neutro Abs 6.9  1.7 - 7.7 (K/uL)    Lymphocytes Relative 21  12 - 46 (%)    Lymphs Abs 2.1  0.7 - 4.0 (K/uL)    Monocytes Relative 8  3 - 12 (%)    Monocytes Absolute 0.8  0.1 - 1.0 (K/uL)    Eosinophils Relative 0  0 - 5 (%)    Eosinophils Absolute 0.0  0.0 - 0.7 (K/uL)    Basophils Relative 0  0 - 1 (%)    Basophils Absolute 0.0  0.0 - 0.1 (K/uL)   COMPREHENSIVE METABOLIC PANEL     Status: Abnormal   Collection Time   10/01/11  5:18 PM      Component Value Range Comment   Sodium 120 (*) 135 - 145 (mEq/L)    Potassium 2.9 (*) 3.5 - 5.1 (mEq/L)    Chloride 82 (*) 96 - 112 (mEq/L)    CO2 30  19 - 32 (mEq/L)    Glucose, Bld 120 (*) 70 - 99 (mg/dL)    BUN 3 (*) 6 - 23 (mg/dL)    Creatinine, Ser 7.82  0.50 - 1.10 (mg/dL)    Calcium 9.6  8.4 - 10.5 (mg/dL)    Total Protein 7.3  6.0 - 8.3 (g/dL)    Albumin 3.9  3.5 - 5.2 (g/dL)    AST 22  0 - 37 (U/L)    ALT 20  0 - 35 (U/L)    Alkaline Phosphatase 103  39 - 117 (U/L)    Total Bilirubin 0.4  0.3 - 1.2 (mg/dL)    GFR calc non Af Amer >90  >90 (mL/min)    GFR calc Af Amer >90  >90 (mL/min)   MAGNESIUM     Status: Normal   Collection Time   10/01/11  5:18 PM      Component Value Range Comment   Magnesium 2.2  1.5 - 2.5 (mg/dL)   ETHANOL     Status: Normal   Collection Time   10/01/11  8:44 PM      Component Value Range Comment   Alcohol, Ethyl (B) <11  0 - 11 (mg/dL)   BASIC METABOLIC PANEL     Status: Abnormal   Collection Time   10/02/11  4:27 AM  Component Value Range Comment   Sodium 127 (*) 135 - 145 (mEq/L) DELTA CHECK NOTED   Potassium 3.1 (*) 3.5 - 5.1 (mEq/L)    Chloride 92 (*) 96 - 112 (mEq/L)    CO2 28  19  - 32 (mEq/L)    Glucose, Bld 98  70 - 99 (mg/dL)    BUN 3 (*) 6 - 23 (mg/dL)    Creatinine, Ser 7.82  0.50 - 1.10 (mg/dL)    Calcium 8.9  8.4 - 10.5 (mg/dL)    GFR calc non Af Amer >90  >90 (mL/min)    GFR calc Af Amer >90  >90 (mL/min)   CBC     Status: Abnormal   Collection Time   10/02/11  5:00 AM      Component Value Range Comment   WBC 8.0  4.0 - 10.5 (K/uL)    RBC 3.83 (*) 3.87 - 5.11 (MIL/uL)    Hemoglobin 13.0  12.0 - 15.0 (g/dL)    HCT 95.6 (*) 21.3 - 46.0 (%)    MCV 93.2  78.0 - 100.0 (fL)    MCH 33.9  26.0 - 34.0 (pg)    MCHC 36.4 (*) 30.0 - 36.0 (g/dL)    RDW 08.6  57.8 - 46.9 (%)    Platelets 211  150 - 400 (K/uL)       Hospital Course: This 55 year old lady was admitted with a good description of a seizure which occurred yesterday afternoon around 4 PM. The seizure lasted approximately 2-3 minutes and was followed by a typical post ictal phase. She has not had any further seizures since admission last night. She was found to have a sodium of 120 and also low potassium. This is likely related to chlorthalidone which she was prescribed for her hypertension. When she was seen in the emergency room on 08/15/2011, she was prescribed lisinopril for her hypertension. At this time her sodium was 127. She is does admit to drinking 6-8 cans of beer per day. She also smokes one  pack of cigarettes per day. This morning she feels back to her usual self without any seizures and is keen to go home.  Discharge Exam: Blood pressure 144/80, pulse 68, temperature 98.2 F (36.8 C), temperature source Oral, resp. rate 16, height 5' 3.5" (1.613 m), weight 68.1 kg (150 lb 2.1 oz), SpO2 98.00%. She looks systemically well. She is alert and orientated. Cranial nerves are intact. There are no focal neurological signs in her limbs. There are no cerebellar signs. Her cognition is normal. Her speech is normal. Heart sounds are present and normal. Lung fields are clinically clear except for a few  scattered wheezes which I believe are chronic due to her smoking history. Abdomen is soft and nontender. There are no signs of chronic liver disease at this stage.  Disposition: Home. Her potassium is 3.1 and this will be supplemented before she goes home. Her sodium is 127 and I anticipate this to improve if this continues alcohol intake. I've told her to followup with the health department to repeat her blood work and monitor her blood pressure. I prescribed for her amlodipine 5 mg daily for her hypertension. This should not affect sodium or potassium levels.  Discharge Orders    Future Orders Please Complete By Expires   Diet - low sodium heart healthy      Increase activity slowly      Discharge instructions      Comments:   No alcohol. No smoking.  Follow-up Information    Follow up with [facility] Department. Make an appointment in 1 week.   Contact information:   [address] [place] [address] [phone]          Signed: Wilson Singer 10/02/2011, 10:46 AM

## 2011-10-02 NOTE — Progress Notes (Signed)
PIV removed without complaint, patient discharged home. Patient verbalizes understanding of discharge instructions, follow up appointments and prescriptions. Patient escorted out by staff, transported by family. 

## 2011-10-02 NOTE — Progress Notes (Signed)
CSW received referral for no PCP or health insurance.  CM aware.  CSW to sign off.  Karn Cassis

## 2011-11-12 ENCOUNTER — Other Ambulatory Visit (HOSPITAL_COMMUNITY): Payer: Self-pay | Admitting: Physician Assistant

## 2011-11-12 DIAGNOSIS — Z1231 Encounter for screening mammogram for malignant neoplasm of breast: Secondary | ICD-10-CM

## 2011-11-13 ENCOUNTER — Other Ambulatory Visit: Payer: Self-pay | Admitting: Obstetrics and Gynecology

## 2011-11-13 DIAGNOSIS — Z1231 Encounter for screening mammogram for malignant neoplasm of breast: Secondary | ICD-10-CM

## 2011-11-17 ENCOUNTER — Ambulatory Visit (HOSPITAL_COMMUNITY)
Admission: RE | Admit: 2011-11-17 | Discharge: 2011-11-17 | Disposition: A | Discharge status: Home / Self Care | Payer: Self-pay | Source: Ambulatory Visit | Attending: Physician Assistant | Admitting: Physician Assistant

## 2011-11-17 DIAGNOSIS — Z1231 Encounter for screening mammogram for malignant neoplasm of breast: Secondary | ICD-10-CM

## 2012-05-08 ENCOUNTER — Emergency Department (HOSPITAL_COMMUNITY)
Admission: EM | Admit: 2012-05-08 | Discharge: 2012-05-08 | Disposition: A | Discharge status: Home / Self Care | Payer: Self-pay | Attending: Emergency Medicine | Admitting: Emergency Medicine

## 2012-05-08 ENCOUNTER — Encounter (HOSPITAL_COMMUNITY): Payer: Self-pay

## 2012-05-08 ENCOUNTER — Emergency Department (HOSPITAL_COMMUNITY): Payer: Self-pay

## 2012-05-08 DIAGNOSIS — W19XXXA Unspecified fall, initial encounter: Secondary | ICD-10-CM

## 2012-05-08 DIAGNOSIS — S52613A Displaced fracture of unspecified ulna styloid process, initial encounter for closed fracture: Secondary | ICD-10-CM

## 2012-05-08 DIAGNOSIS — S52509A Unspecified fracture of the lower end of unspecified radius, initial encounter for closed fracture: Secondary | ICD-10-CM | POA: Insufficient documentation

## 2012-05-08 DIAGNOSIS — S52609A Unspecified fracture of lower end of unspecified ulna, initial encounter for closed fracture: Secondary | ICD-10-CM | POA: Insufficient documentation

## 2012-05-08 DIAGNOSIS — S52502A Unspecified fracture of the lower end of left radius, initial encounter for closed fracture: Secondary | ICD-10-CM

## 2012-05-08 DIAGNOSIS — W010XXA Fall on same level from slipping, tripping and stumbling without subsequent striking against object, initial encounter: Secondary | ICD-10-CM | POA: Insufficient documentation

## 2012-05-08 DIAGNOSIS — F17200 Nicotine dependence, unspecified, uncomplicated: Secondary | ICD-10-CM | POA: Insufficient documentation

## 2012-05-08 DIAGNOSIS — I10 Essential (primary) hypertension: Secondary | ICD-10-CM | POA: Insufficient documentation

## 2012-05-08 MED ORDER — ONDANSETRON 4 MG PO TBDP
4.0000 mg | ORAL_TABLET | Freq: Once | ORAL | Status: AC
  Administered 2012-05-08: 4 mg via ORAL
  Filled 2012-05-08: qty 1

## 2012-05-08 MED ORDER — HYDROMORPHONE HCL PF 2 MG/ML IJ SOLN
2.0000 mg | Freq: Once | INTRAMUSCULAR | Status: AC
  Administered 2012-05-08: 2 mg via INTRAMUSCULAR
  Filled 2012-05-08: qty 1

## 2012-05-08 MED ORDER — OXYCODONE-ACETAMINOPHEN 5-325 MG PO TABS: ORAL_TABLET | ORAL | Status: AC

## 2012-05-08 NOTE — Discharge Instructions (Signed)
Elevate your arm. Use ice packs to keep the swelling down. Take the percocet for bad pain, you can also take ibuprofen 600 mg 4 times a day for pain and it will also help with the swelling. Leave the splint in place until you can be rechecked by dr Merlyn Lot, the orthopedic hand specialist on call today. Call his office on Monday morning to get an appointment to see him to discuss your surgery to fix your wrist.    Cast or Splint Care Casts and splints support injured limbs and keep bones from moving while they heal.  HOME CARE  Keep the cast or splint uncovered during the drying period.   A plaster cast can take 24 to 48 hours to dry.   A fiberglass cast will dry in less than 1 hour.   Do not rest the cast on anything harder than a pillow for 24 hours.   Do not put weight on your injured limb. Do not put pressure on the cast. Wait for your doctor's approval.   Keep the cast or splint dry.   Cover the cast or splint with a plastic bag during baths or wet weather.   If you have a cast over your chest and belly (trunk), take sponge baths until the cast is taken off.   Keep your cast or splint clean. Wash a dirty cast with a damp cloth.   Do not put any objects under your cast or splint. Do not scratch the skin under the cast with an object.   Do not take out the padding from inside your cast.   Exercise your joints near the cast as told by your doctor.   Raise (elevate) your injured limb on 1 or 2 pillows for the first 1 to 3 days.  GET HELP RIGHT AWAY IF:  Your cast or splint cracks.   Your cast or splint is too tight or too loose.   You itch badly under the cast.   Your cast gets wet or has a soft spot.   You have a bad smell coming from the cast.   You get an object stuck under the cast.   Your skin around the cast becomes red or raw.   You have new or more pain after the cast is put on.   You have fluid leaking through the cast.   You cannot move your fingers or  toes.   Your fingers or toes turn colors or are cool, painful, or puffy (swollen).   You have tingling or lose feeling (numbness) around the injured area.   You have pain or pressure under the cast.   You have trouble breathing or have shortness of breath.   You have chest pain.  MAKE SURE YOU:  Understand these instructions.   Will watch your condition.   Will get help right away if you are not doing well or get worse.  Document Released: 03/05/2011 Document Revised: 10/23/2011 Document Reviewed: 03/05/2011 [facility] Patient Information 2012 [place], [place].Wrist Fracture A fracture and a broken bone mean basically the same thing. A fractured wrist is often due to a fall on an outstretched hand. The most important part of treatment is immobilizing the wrist, and sometimes the elbow. You will be in a splint or cast for 4-6 weeks. If there is significant damage to the wrist, this must be corrected in order to ensure proper healing. Minor fractures without deformity do not requiresetting the bone (reduction) to heal correctly.  HOME CARE INSTRUCTIONS  Do not remove your splint or cast that has been applied to treat your injury.   If there is itching inside of the splint/cast area, DO NOT put anything between the splint/cast and skin to scratch the area. This can cause injury.   Keep your wrist elevated on pillows for 3-4 days to reduce swelling. A sling may also be used to help rest your injury.   Place ice packs over the wrist splint or cast for the next 2-3 days to help control pain and swelling.   Pain and anti-inflammatory medicine may be needed for the next few days.   Call your caregiver to arrange follow-up within the next few days as recommended.  SEEK MEDICAL CARE IF:   You have increased pain or pressure around your injury, or if your hand becomes numb, cold, or pale.   You have loss of movement of the thumb or fingers.   You develop worsening swelling of your hand,  fingers, and thumb.   Your splint/cast gets damaged or comes off.  MAKE SURE YOU:   Understand these instructions.   Will watch your condition.   Will get help right away if you are not doing well or get worse.  Document Released: 11/03/2005 Document Revised: 10/23/2011 Document Reviewed: 05/01/2007 [facility] Patient Information 2012 [place], [place].

## 2012-05-08 NOTE — ED Notes (Signed)
Pt states she tripped over tent straps and fell on left hand. Deformity noted

## 2012-05-08 NOTE — ED Provider Notes (Signed)
History    This chart was scribed for Ward Givens, MD, MD by Smitty Pluck. The patient was seen in room APA09 and the patient's care was started at 1:44PM.   CSN: [id number]  Arrival date & time 05/08/12  1150   First MD Initiated Contact with Patient 05/08/12 1303      Chief Complaint  Patient presents with  . Hand Injury    (Consider location/radiation/quality/duration/timing/severity/associated sxs/prior treatment) Patient is a 56 y.o. female presenting with hand injury. The history is provided by the patient.  Hand Injury    Cassandra Salazar is a 56 y.o. female who presents to the Emergency Department complaining of moderate left hand injury due to fall today.  Pt reports she was camping and tripped over tent straps and fell onto her left hand. Mild pain has been constant without radiation. Pt denies LOC or head injury. She reports that she hit her chin and scrapped her legs. Denies any other pain. States sensation is different from right hand.   PCP [facility]  Past Medical History  Diagnosis Date  . Hypertension     History reviewed. No pertinent past surgical history.  Family History  Problem Relation Age of Onset  . Hypertension Father   . Alcohol abuse Father   . Hypertension Brother   . Diabetes type II Brother   . Diabetes Mother   . Kidney failure Mother     ESRD from DM  . Diabetes type II Mother   . Coronary artery disease Mother   . Diabetes Brother   . Diabetes type II Brother   . Hypertension Brother   . Diabetes Brother   . Alcohol abuse Brother   . Alcohol abuse Brother   . Alcohol abuse Brother   . Alcohol abuse Brother     History  Substance Use Topics  . Smoking status: Current Everyday Smoker -- 1.0 packs/day for 30 years    Types: Cigarettes  . Smokeless tobacco: Not on file  . Alcohol Use: 0.0 oz/week    6-8 Cans of beer per week     beer daily  employed Lives with spouse  OB History    Grav Para Term Preterm  Abortions TAB SAB Ect Mult Living                  Review of Systems  All other systems reviewed and are negative.   10 Systems reviewed and all are negative for acute change except as noted in the HPI.   Allergies  Lisinopril; Ampicillin; and Mango flavor  Home Medications   Current Outpatient Rx  Name Route Sig Dispense Refill  . AMLODIPINE BESYLATE 10 MG PO TABS Oral Take 10 mg by mouth daily.    Marland Kitchen AMLODIPINE BESYLATE 5 MG PO TABS Oral Take 5 mg by mouth daily.    Marland Kitchen CLONIDINE HCL 0.1 MG PO TABS Oral Take 0.1 mg by mouth 2 (two) times daily.    . OMEGA-3 FATTY ACIDS 1000 MG PO CAPS Oral Take 2 g by mouth daily.    . ADULT MULTIVITAMIN W/MINERALS CH Oral Take 1 tablet by mouth daily.    Marland Kitchen POTASSIUM 99 MG PO TABS Oral Take 1 tablet by mouth daily.      BP 153/74  Temp 98.2 F (36.8 C)  Resp 20  Ht 5\' 4"  (1.626 m)  Wt 142 lb (64.411 kg)  BMI 24.37 kg/m2  SpO2 99%  Vital signs normal  Physical Exam  Nursing note and vitals reviewed. Constitutional: She is oriented to person, place, and time. She appears well-developed and well-nourished. No distress.  HENT:  Head: Normocephalic.  Right Ear: External ear normal.  Left Ear: External ear normal.  Nose: Nose normal.  Mouth/Throat: Oropharynx is clear and moist.       Small redness to her chin, no pain on ROM of manidble  Eyes: Conjunctivae are normal. Pupils are equal, round, and reactive to light.  Neck: Normal range of motion. Neck supple.  Pulmonary/Chest: Effort normal. No respiratory distress.  Abdominal: Soft. She exhibits no distension.  Musculoskeletal:       Deformity and swelling of left wrist Intact pulses Good ROM of fingers Pain on attempted supination  Ulnar and median nerves intact Unable to dorsiflex wrist because of deformity ? Less sensation in fingers c/t right Color normal.  Small abrasion to left knee, no effusion  Neurological: She is alert and oriented to person, place, and time.    Skin: Skin is warm and dry.  Psychiatric: She has a normal mood and affect. Her behavior is normal.    ED Course  Procedures (including critical care time)   Medications  HYDROmorphone (DILAUDID) injection 2 mg (2 mg Intramuscular Given 05/08/12 1410)  ondansetron (ZOFRAN-ODT) disintegrating tablet 4 mg (4 mg Oral Given 05/08/12 1408)    Pt initially refused pain meds.   DIAGNOSTIC STUDIES: Oxygen Saturation is 99% on room air, normal by my interpretation.    COORDINATION OF CARE: 1:47PM EDP discusses pt ED treatment with pt.   1:20PM OR Nurse with Dr Merlyn Lot, will look at films and call back   14:27 Dr Merlyn Lot has reviewed her films, states to put in sugar tong splint and see in the office this week.   14:37 PM EDP rechecks pt and discusses what orthopedic surgeon recommended.    Dg Wrist Complete Left  05/08/2012  *RADIOLOGY REPORT*  Clinical Data: Fall  LEFT WRIST - COMPLETE 3+ VIEW  Comparison: None.  Findings: Minimally displaced ulnar styloid fracture.  Comminuted intra-articular distal radius fracture with marked dorsal angulation of the distal radius fracture fragment.  IMPRESSION: Comminuted distal radius fracture.  Ulnar styloid fracture. Per CMS PQRS reporting requirements (PQRS Measure 24): Given the patient's age of greater than 50 and the fracture site (hip, distal radius, or spine), the patient should be tested for osteoporosis using DXA, and the appropriate treatment considered based on the DXA results.  Original Report Authenticated By: Donavan Burnet, M.D.     1. Fracture of radius, distal, left, closed   2. Fracture Of Ulnar Styloid   3. Fall     New Prescriptions   OXYCODONE-ACETAMINOPHEN (PERCOCET) 5-325 MG PER TABLET    Take 1 or 2 po Q 6hrs for pain    Plan discharge  Devoria Albe, MD, FACEP   MDM   I personally performed the services described in this documentation, which was scribed in my presence. The recorded information has been reviewed and  considered.  Devoria Albe, MD, FACEP       Ward Givens, MD 05/08/12 1535

## 2012-05-10 ENCOUNTER — Other Ambulatory Visit: Payer: Self-pay | Admitting: Orthopedic Surgery

## 2012-05-10 ENCOUNTER — Encounter (HOSPITAL_BASED_OUTPATIENT_CLINIC_OR_DEPARTMENT_OTHER): Payer: Self-pay | Admitting: *Deleted

## 2012-05-10 ENCOUNTER — Encounter (HOSPITAL_BASED_OUTPATIENT_CLINIC_OR_DEPARTMENT_OTHER)
Admission: RE | Admit: 2012-05-10 | Discharge: 2012-05-10 | Disposition: A | Discharge status: Home / Self Care | Payer: Self-pay | Source: Ambulatory Visit | Attending: Orthopedic Surgery | Admitting: Orthopedic Surgery

## 2012-05-10 LAB — BASIC METABOLIC PANEL
BUN: 6 mg/dL (ref 6–23)
Chloride: 96 mEq/L (ref 96–112)
GFR calc Af Amer: >90 mL/min (ref >90)
GFR calc non Af Amer: >90 mL/min (ref >90)
Potassium: 4.9 mEq/L (ref 3.5–5.1)
Sodium: 134 mEq/L — ABNORMAL LOW (ref 135–145)

## 2012-05-10 NOTE — Progress Notes (Signed)
bmet done

## 2012-05-11 ENCOUNTER — Encounter (HOSPITAL_BASED_OUTPATIENT_CLINIC_OR_DEPARTMENT_OTHER): Payer: Self-pay | Admitting: Certified Registered Nurse Anesthetist

## 2012-05-11 ENCOUNTER — Encounter (HOSPITAL_BASED_OUTPATIENT_CLINIC_OR_DEPARTMENT_OTHER)
Admission: RE | Disposition: A | Discharge status: Home / Self Care | Payer: Self-pay | Source: Ambulatory Visit | Attending: Orthopedic Surgery

## 2012-05-11 ENCOUNTER — Ambulatory Visit (HOSPITAL_BASED_OUTPATIENT_CLINIC_OR_DEPARTMENT_OTHER)
Admission: RE | Admit: 2012-05-11 | Discharge: 2012-05-11 | Disposition: A | Discharge status: Home / Self Care | Payer: Self-pay | Source: Ambulatory Visit | Attending: Orthopedic Surgery | Admitting: Orthopedic Surgery

## 2012-05-11 ENCOUNTER — Encounter (HOSPITAL_BASED_OUTPATIENT_CLINIC_OR_DEPARTMENT_OTHER): Payer: Self-pay | Admitting: Orthopedic Surgery

## 2012-05-11 ENCOUNTER — Ambulatory Visit (HOSPITAL_BASED_OUTPATIENT_CLINIC_OR_DEPARTMENT_OTHER): Payer: Self-pay | Admitting: Certified Registered Nurse Anesthetist

## 2012-05-11 DIAGNOSIS — R569 Unspecified convulsions: Secondary | ICD-10-CM | POA: Insufficient documentation

## 2012-05-11 DIAGNOSIS — I10 Essential (primary) hypertension: Secondary | ICD-10-CM | POA: Insufficient documentation

## 2012-05-11 DIAGNOSIS — S52599A Other fractures of lower end of unspecified radius, initial encounter for closed fracture: Secondary | ICD-10-CM | POA: Insufficient documentation

## 2012-05-11 DIAGNOSIS — W010XXA Fall on same level from slipping, tripping and stumbling without subsequent striking against object, initial encounter: Secondary | ICD-10-CM | POA: Insufficient documentation

## 2012-05-11 LAB — POCT HEMOGLOBIN-HEMACUE: Hemoglobin: 14 g/dL (ref 12.0–15.0)

## 2012-05-11 SURGERY — OPEN REDUCTION INTERNAL FIXATION (ORIF) DISTAL RADIUS FRACTURE
Anesthesia: General | Site: Wrist | Laterality: Left | Wound class: Clean

## 2012-05-11 MED ORDER — 0.9 % SODIUM CHLORIDE (POUR BTL) OPTIME
TOPICAL | Status: DC | PRN
  Administered 2012-05-11: 1000 mL

## 2012-05-11 MED ORDER — FENTANYL CITRATE 0.05 MG/ML IJ SOLN: 25.0000 ug | INTRAMUSCULAR | Status: DC | PRN

## 2012-05-11 MED ORDER — MIDAZOLAM HCL 5 MG/5ML IJ SOLN
INTRAMUSCULAR | Status: DC | PRN
  Administered 2012-05-11: 1 mg via INTRAVENOUS

## 2012-05-11 MED ORDER — LIDOCAINE HCL (CARDIAC) 20 MG/ML IV SOLN
INTRAVENOUS | Status: DC | PRN
  Administered 2012-05-11: 30 mg via INTRAVENOUS

## 2012-05-11 MED ORDER — METOCLOPRAMIDE HCL 5 MG/ML IJ SOLN: 10.0000 mg | Freq: Once | INTRAMUSCULAR | Status: DC | PRN

## 2012-05-11 MED ORDER — PROPOFOL 10 MG/ML IV EMUL
INTRAVENOUS | Status: DC | PRN
  Administered 2012-05-11: 200 mg via INTRAVENOUS

## 2012-05-11 MED ORDER — MIDAZOLAM HCL 2 MG/2ML IJ SOLN
0.5000 mg | INTRAMUSCULAR | Status: DC | PRN
  Administered 2012-05-11: 2 mg via INTRAVENOUS

## 2012-05-11 MED ORDER — FENTANYL CITRATE 0.05 MG/ML IJ SOLN
50.0000 ug | INTRAMUSCULAR | Status: DC | PRN
  Administered 2012-05-11: 100 ug via INTRAVENOUS

## 2012-05-11 MED ORDER — LACTATED RINGERS IV SOLN
INTRAVENOUS | Status: DC
  Administered 2012-05-11 (×2): via INTRAVENOUS

## 2012-05-11 MED ORDER — ROPIVACAINE HCL 5 MG/ML IJ SOLN
INTRAMUSCULAR | Status: DC | PRN
  Administered 2012-05-11: 30 mL via EPIDURAL

## 2012-05-11 MED ORDER — LIDOCAINE HCL 1 % IJ SOLN
INTRAMUSCULAR | Status: DC | PRN
  Administered 2012-05-11: 2 mL via INTRADERMAL

## 2012-05-11 MED ORDER — ONDANSETRON HCL 4 MG/2ML IJ SOLN
INTRAMUSCULAR | Status: DC | PRN
  Administered 2012-05-11: 4 mg via INTRAVENOUS

## 2012-05-11 MED ORDER — VANCOMYCIN HCL IN DEXTROSE 1-5 GM/200ML-% IV SOLN
1000.0000 mg | INTRAVENOUS | Status: AC
  Administered 2012-05-11: 1000 mg via INTRAVENOUS

## 2012-05-11 MED ORDER — METOCLOPRAMIDE HCL 5 MG/ML IJ SOLN
INTRAMUSCULAR | Status: DC | PRN
  Administered 2012-05-11: 10 mg via INTRAVENOUS

## 2012-05-11 MED ORDER — CHLORHEXIDINE GLUCONATE 4 % EX LIQD: 60.0000 mL | Freq: Once | CUTANEOUS | Status: DC

## 2012-05-11 MED ORDER — OXYCODONE HCL 5 MG PO TABS: 5.0000 mg | ORAL_TABLET | Freq: Once | ORAL | Status: DC | PRN

## 2012-05-11 MED ORDER — EPHEDRINE SULFATE 50 MG/ML IJ SOLN
INTRAMUSCULAR | Status: DC | PRN
  Administered 2012-05-11: 10 mg via INTRAVENOUS

## 2012-05-11 MED ORDER — DEXAMETHASONE SODIUM PHOSPHATE 10 MG/ML IJ SOLN
INTRAMUSCULAR | Status: DC | PRN
  Administered 2012-05-11: 10 mg via INTRAVENOUS

## 2012-05-11 MED ORDER — HYDROCODONE-ACETAMINOPHEN 5-325 MG PO TABS: ORAL_TABLET | ORAL | Status: AC

## 2012-05-11 SURGICAL SUPPLY — 75 items
BANDAGE CONFORM 3  STR LF (GAUZE/BANDAGES/DRESSINGS) IMPLANT
BANDAGE ELASTIC 3 VELCRO ST LF (GAUZE/BANDAGES/DRESSINGS) ×2 IMPLANT
BANDAGE GAUZE ELAST BULKY 4 IN (GAUZE/BANDAGES/DRESSINGS) ×2 IMPLANT
BIT DRILL 2.0 LNG QUCK RELEASE (BIT) ×1 IMPLANT
BIT DRILL 2.8X5 QR DISP (BIT) ×2 IMPLANT
BLADE MINI RND TIP GREEN BEAV (BLADE) IMPLANT
BLADE SURG 15 STRL LF DISP TIS (BLADE) ×2 IMPLANT
BLADE SURG 15 STRL SS (BLADE) ×2
BNDG ELASTIC 2 VLCR STRL LF (GAUZE/BANDAGES/DRESSINGS) ×2 IMPLANT
BNDG ESMARK 4X9 LF (GAUZE/BANDAGES/DRESSINGS) ×2 IMPLANT
CHLORAPREP W/TINT 26ML (MISCELLANEOUS) ×2 IMPLANT
CLOTH BEACON ORANGE TIMEOUT ST (SAFETY) ×2 IMPLANT
CORDS BIPOLAR (ELECTRODE) ×2 IMPLANT
COVER MAYO STAND STRL (DRAPES) ×2 IMPLANT
COVER TABLE BACK 60X90 (DRAPES) ×2 IMPLANT
DRAPE EXTREMITY T 121X128X90 (DRAPE) ×2 IMPLANT
DRAPE OEC MINIVIEW 54X84 (DRAPES) ×2 IMPLANT
DRAPE SURG 17X23 STRL (DRAPES) ×2 IMPLANT
DRILL 2.0 LNG QUICK RELEASE (BIT) ×2
GAUZE XEROFORM 1X8 LF (GAUZE/BANDAGES/DRESSINGS) ×2 IMPLANT
GLOVE BIO SURGEON STRL SZ 6.5 (GLOVE) ×2 IMPLANT
GLOVE BIO SURGEON STRL SZ7.5 (GLOVE) ×2 IMPLANT
GLOVE BIOGEL PI IND STRL 7.0 (GLOVE) ×1 IMPLANT
GLOVE BIOGEL PI IND STRL 7.5 (GLOVE) ×1 IMPLANT
GLOVE BIOGEL PI IND STRL 8 (GLOVE) ×1 IMPLANT
GLOVE BIOGEL PI IND STRL 8.5 (GLOVE) ×1 IMPLANT
GLOVE BIOGEL PI INDICATOR 7.0 (GLOVE) ×1
GLOVE BIOGEL PI INDICATOR 7.5 (GLOVE) ×1
GLOVE BIOGEL PI INDICATOR 8 (GLOVE) ×1
GLOVE BIOGEL PI INDICATOR 8.5 (GLOVE) ×1
GLOVE ECLIPSE 6.5 STRL STRAW (GLOVE) ×2 IMPLANT
GLOVE ECLIPSE 7.0 STRL STRAW (GLOVE) ×2 IMPLANT
GLOVE SURG ORTHO 8.0 STRL STRW (GLOVE) ×2 IMPLANT
GOWN PREVENTION PLUS XLARGE (GOWN DISPOSABLE) ×8 IMPLANT
GOWN STRL REIN XL XLG (GOWN DISPOSABLE) ×4 IMPLANT
GUIDEWIRE ORTHO 0.054X6 (WIRE) ×6 IMPLANT
NEEDLE HYPO 22GX1.5 SAFETY (NEEDLE) IMPLANT
NEEDLE HYPO 25X1 1.5 SAFETY (NEEDLE) IMPLANT
NS IRRIG 1000ML POUR BTL (IV SOLUTION) ×2 IMPLANT
PACK BASIN DAY SURGERY FS (CUSTOM PROCEDURE TRAY) ×2 IMPLANT
PAD CAST 3X4 CTTN HI CHSV (CAST SUPPLIES) ×1 IMPLANT
PAD CAST 4YDX4 CTTN HI CHSV (CAST SUPPLIES) IMPLANT
PADDING CAST ABS 4INX4YD NS (CAST SUPPLIES) ×1
PADDING CAST ABS COTTON 4X4 ST (CAST SUPPLIES) ×1 IMPLANT
PADDING CAST COTTON 3X4 STRL (CAST SUPPLIES) ×1
PADDING CAST COTTON 4X4 STRL (CAST SUPPLIES)
PLATE LEFT DIST RADIUS NARROW (Plate) ×2 IMPLANT
SCREW 3.5MMX10.0MM (Screw) ×2 IMPLANT
SCREW 3.5MMX12.0MM (Screw) ×2 IMPLANT
SCREW CORT 3.5X14 (Screw) ×2 IMPLANT
SCREW CORT FT 18X2.3XLCK HEX (Screw) ×1 IMPLANT
SCREW CORTICAL LOCKING 2.3X18M (Screw) ×3 IMPLANT
SCREW CORTICAL LOCKING 2.3X20M (Screw) ×1 IMPLANT
SCREW CORTICAL LOCKING 2.3X22M (Screw) ×1 IMPLANT
SCREW FX18X2.3XSMTH LCK NS CRT (Screw) ×2 IMPLANT
SCREW FX20X2.3XSMTH LCK NS CRT (Screw) ×1 IMPLANT
SCREW FX22X2.3XLCK SMTH NS CRT (Screw) ×1 IMPLANT
SCREW NON TOGG 2.3X20MM (Screw) ×2 IMPLANT
SLEEVE SCD COMPRESS KNEE MED (MISCELLANEOUS) ×2 IMPLANT
SPLINT PLASTER CAST XFAST 4X15 (CAST SUPPLIES) ×10 IMPLANT
SPLINT PLASTER XTRA FAST SET 4 (CAST SUPPLIES) ×10
SPONGE GAUZE 4X4 12PLY (GAUZE/BANDAGES/DRESSINGS) ×2 IMPLANT
STOCKINETTE 4X48 STRL (DRAPES) ×2 IMPLANT
SUCTION FRAZIER TIP 10 FR DISP (SUCTIONS) IMPLANT
SUT ETHILON 3 0 PS 1 (SUTURE) IMPLANT
SUT ETHILON 4 0 PS 2 18 (SUTURE) IMPLANT
SUT VIC AB 3-0 PS1 18 (SUTURE)
SUT VIC AB 3-0 PS1 18XBRD (SUTURE) IMPLANT
SUT VICRYL 4-0 PS2 18IN ABS (SUTURE) ×2 IMPLANT
SYR BULB 3OZ (MISCELLANEOUS) ×2 IMPLANT
SYR CONTROL 10ML LL (SYRINGE) IMPLANT
TOWEL OR 17X24 6PK STRL BLUE (TOWEL DISPOSABLE) ×4 IMPLANT
TUBE CONNECTING 20X1/4 (TUBING) IMPLANT
UNDERPAD 30X30 INCONTINENT (UNDERPADS AND DIAPERS) ×2 IMPLANT
WATER STERILE IRR 1000ML POUR (IV SOLUTION) IMPLANT

## 2012-05-11 NOTE — Op Note (Signed)
NAMEJILLANA, SELPH NO.:  [id number]  MEDICAL RECORD NO.:  [id number]  LOCATION:                                 FACILITY:  PHYSICIAN:  Betha Loa, MD        DATE OF BIRTH:  August 26, 1956  DATE OF PROCEDURE:  05/11/2012 DATE OF DISCHARGE:                              OPERATIVE REPORT   PREOPERATIVE DIAGNOSIS:  Left distal radius fracture.  POSTOPERATIVE DIAGNOSIS:  Left distal radius fracture.  PROCEDURE:  Open reduction and internal fixation, left distal radius fracture.  SURGEON:  Betha Loa, MD  ASSISTANT:  Cindee Salt, MD  ANESTHESIA:  General with regional.  IV FLUIDS:  Per anesthesia flow sheet.  ESTIMATED BLOOD LOSS:  Minimal.  COMPLICATIONS:  None.  SPECIMENS:  None.  TOURNIQUET TIME:  50 minutes.  DISPOSITION:  Stable to PACU.  INDICATIONS:  Cassandra Salazar is a 56 year old female who tripped over a tent tie down 3 days ago.  She was seen at [facility] where she was evaluated and found to have a distal radius fracture.  She was placed in sugar-tong splint and followed up with me in the office.  On evaluation, she had intact sensation and capillary refill in all fingertips.  She could flex and extend the IP joint thumb across fingers.  Radiographs showed a distal angulated distal radius fracture.  I discussed with Ms. Cribb the nature of the injury.  We discussed operative treatment options.  She wished to have open reduction and internal fixation.  Risks, benefits, and alternatives of surgery were reviewed including the risk of blood loss, infection, damage to nerves, vessels, tendons, ligaments, bone, failure to surgery, need for additional surgery, complications, wound healing, continued pain, nonunion, malunion, stiffness.  She voiced understanding of these risks and elected to proceed.  OPERATIVE COURSE:  After being identified preoperatively by myself, the patient and I agreed upon procedure and site  procedure.  Surgical site was marked.  The risks, benefits, and alternatives of surgery were reviewed and she wished to proceed.  Surgical consent signed.  She was given 1 g of IV vancomycin as preoperative antibiotic prophylaxis due to penicillin allergy.  A regional block was performed by Anesthesia in preoperative holding.  She was transported to the operating room and placed on operating table in supine position with left upper extremity on arm board.  General anesthesia was induced by the anesthesiologist. The left upper extremity was prepped and draped in normal sterile orthopedic fashion.  Surgical pause was performed between surgeons, anesthesia, operating staff, and all were in agreement with the patient, procedure, and site of procedure.  Tourniquet at proximal aspect of the extremity was inflated to 250 mmHg after exsanguination of limb with an Esmarch bandage.  Standard volar Sherilyn Cooter approach was used.  Bipolar electrocautery was used in subcutaneous tissues to obtain hemostasis. The superficial and deep portions of the FCR tendon sheath were incised and the FCR and FPL tendons swept ulnarly to protect the palmar cutaneous branch of median nerve.  The pronator quadratus was released from the radial side of the radius and elevated with a periosteal elevator.  Brachioradialis was released from the radial side  of the radius.  Fracture site was easily identified.  It was cleared of soft tissue interposition.  Reduction was obtained under direct visualization.  A narrow plate from the Acumed volar distal radial locking set was selected and secured to the bone using the guide pins. C-arm was used in AP, lateral, and oblique projections to ensure appropriate reduction and position of hardware.  The hardware was adjusted as necessary.  A screw was placed into the slotted hole in the shaft of the plate.  The distal screw holes were then filled using standard AO drilling and measuring  technique.  A nonlocking screw was placed initially to bring the bone up to the plate.  The remaining holes were filled with nonlocking pegs with the exception of the styloid holes where the distal was filled with a locking screw and the nonlocking screw was placed into the more proximal styloid screw hole.  The remaining holes were all filled with smooth locking pegs.  The remaining 2 holes in the shaft of the plate were then filled with nonlocking screws.  There was good purchase in all screws.  The C-arm was used in AP, lateral, and oblique projections to ensure appropriate reduction and position of hardware which was the case.  There was no intra-articular penetration.  The wound was copiously irrigated with sterile saline. The pronator quadratus was repaired back over top of the plate using 4-0 Vicryl suture.  Subcutaneous tissues were closed with 4-0 Vicryl suture in inverted interrupted fashion.  Skin was closed with 4-0 nylon in a horizontal mattress fashion.  The wound was dressed with sterile Xeroform, 4x4s, and wrapped with Kerlix.  A volar splint was placed and wrapped with Kerlix and Ace bandage.  Tourniquet was deflated at 50 minutes.  Fingertips were pink with brisk capillary refill after deflation of tourniquet.  Operative drapes were broken down.  The patient was awoken from anesthesia safely.  She was transferred back to stretcher and taken to PACU in stable condition.  I will see her back in the office in 1 week for postoperative followup.  I will give her Norco 5/325, 1-2 p.o. q.6 hours p.r.n. pain, dispensed #40.     Betha Loa, MD     KK/MEDQ  D:  05/11/2012  T:  05/11/2012  Job:  [id number]

## 2012-05-11 NOTE — Anesthesia Procedure Notes (Addendum)
Anesthesia Regional Block:  Supraclavicular block  Pre-Anesthetic Checklist: ,, timeout performed, Correct Patient, Correct Site, Correct Laterality, Correct Procedure, Correct Position, site marked, Risks and benefits discussed,  Surgical consent,  Pre-op evaluation,  At surgeon's request and post-op pain management  Laterality: Left  Prep: chloraprep       Needles:   Needle Type: Other   (Arrow Echogenic)   Needle Length: 9cm  Needle Gauge: 21    Additional Needles:  Procedures: ultrasound guided Supraclavicular block Narrative:  Start time: 05/11/2012 11:04 AM End time: 05/11/2012 11:12 AM Injection made incrementally with aspirations every 5 mL.  Performed by: Personally  Anesthesiologist: C Frederick  Additional Notes: Ultrasound guidance used to: id relevant anatomy, confirm needle position, local anesthetic spread, avoidance of vascular puncture. Picture saved. No complications. Block performed personally by Janetta Hora. Gelene Mink, MD    Supraclavicular block Procedure Name: LMA Insertion Date/Time: 05/11/2012 11:34 AM Performed by: Kanyia Heaslip D Pre-anesthesia Checklist: Patient identified, Emergency Drugs available, Suction available and Patient being monitored Patient Re-evaluated:Patient Re-evaluated prior to inductionOxygen Delivery Method: Circle System Utilized Preoxygenation: Pre-oxygenation with 100% oxygen Intubation Type: IV induction Ventilation: Mask ventilation without difficulty LMA: LMA inserted LMA Size: 4.0 Number of attempts: 1 Airway Equipment and Method: bite block Placement Confirmation: positive ETCO2 Tube secured with: Tape Dental Injury: Teeth and Oropharynx as per pre-operative assessment

## 2012-05-11 NOTE — Transfer of Care (Signed)
Immediate Anesthesia Transfer of Care Note  Patient: Cassandra Salazar  Procedure(s) Performed: Procedure(s) (LRB): OPEN REDUCTION INTERNAL FIXATION (ORIF) DISTAL RADIAL FRACTURE (Left)  Patient Location: PACU  Anesthesia Type: General and Regional  Level of Consciousness: awake and sedated  Airway & Oxygen Therapy: Patient Spontanous Breathing and Patient connected to face mask oxygen  Post-op Assessment: Report given to PACU RN and Post -op Vital signs reviewed and stable  Post vital signs: Reviewed and stable  Complications: No apparent anesthesia complications

## 2012-05-11 NOTE — Discharge Instructions (Addendum)
°  Post Anesthesia Home Care Instructions ° °Activity: °Get plenty of rest for the remainder of the day. A responsible adult should stay with you for 24 hours following the procedure.  °For the next 24 hours, DO NOT: °-Drive a car °-Operate machinery °-Drink alcoholic beverages °-Take any medication unless instructed by your physician °-Make any legal decisions or sign important papers. ° °Meals: °Start with liquid foods such as gelatin or soup. Progress to regular foods as tolerated. Avoid greasy, spicy, heavy foods. If nausea and/or vomiting occur, drink only clear liquids until the nausea and/or vomiting subsides. Call your physician if vomiting continues. ° °Special Instructions/Symptoms: °Your throat may feel dry or sore from the anesthesia or the breathing tube placed in your throat during surgery. If this causes discomfort, gargle with warm salt water. The discomfort should disappear within 24 hours. ° ° ° ° °Regional Anesthesia Blocks ° °1. Numbness or the inability to move the "blocked" extremity may last from 3-48 hours after placement. The length of time depends on the medication injected and your individual response to the medication. If the numbness is not going away after 48 hours, call your surgeon. ° °2. The extremity that is blocked will need to be protected until the numbness is gone and the  Strength has returned. Because you cannot feel it, you will need to take extra care to avoid injury. Because it may be weak, you may have difficulty moving it or using it. You may not know what position it is in without looking at it while the block is in effect. ° °3. For blocks in the legs and feet, returning to weight bearing and walking needs to be done carefully. You will need to wait until the numbness is entirely gone and the strength has returned. You should be able to move your leg and foot normally before you try and bear weight or walk. You will need someone to be with you when you first try to  ensure you do not fall and possibly risk injury. ° °4. Bruising and tenderness at the needle site are common side effects and will resolve in a few days. ° °5. Persistent numbness or new problems with movement should be communicated to the surgeon or the [facility] ([phone])/ [facility] ([phone]). ° ° ° ° °Hand Center Instructions °Hand Surgery ° °Wound Care: °Keep your hand elevated above the level of your heart.  Do not allow it to dangle by your side.  Keep the dressing dry and do not remove it unless your doctor advises you to do so.  He will usually change it at the time of your post-op visit.  Moving your fingers is advised to stimulate circulation but will depend on the site of your surgery.  If you have a splint applied, your doctor will advise you regarding movement. ° °Activity: °Do not drive or operate machinery today.  Rest today and then you may return to your normal activity and work as indicated by your physician. ° °Diet:  °Drink liquids today or eat a light diet.  You may resume a regular diet tomorrow.   ° °General expectations: °Pain for two to three days. °Fingers may become slightly swollen. ° °Call your doctor if any of the following occur: °Severe pain not relieved by pain medication. °Elevated temperature. °Dressing soaked with blood. °Inability to move fingers. °White or bluish color to fingers. °

## 2012-05-11 NOTE — H&P (Signed)
Cassandra Salazar is an 56 y.o. female.   Chief Complaint: distal radius fracture HPI: 56 yo rhd female tripped over tent tie down while camping June 22.  Had pain and deformity of wrist.  Seen at [facility] where XR revealed left distal radius fracture with angulation.  Splinted and followed up in office.  Reports injury to one arm as a child and a bruise on chin from this fall but no other injuries.    Past Medical History  Diagnosis Date  . Hypertension     Past Surgical History  Procedure Date  . No past surgeries     Family History  Problem Relation Age of Onset  . Hypertension Father   . Alcohol abuse Father   . Hypertension Brother   . Diabetes type II Brother   . Diabetes Mother   . Kidney failure Mother     ESRD from DM  . Diabetes type II Mother   . Coronary artery disease Mother   . Diabetes Brother   . Diabetes type II Brother   . Hypertension Brother   . Diabetes Brother   . Alcohol abuse Brother   . Alcohol abuse Brother   . Alcohol abuse Brother   . Alcohol abuse Brother    Social History:  reports that she has been smoking Cigarettes.  She has a 30 pack-year smoking history. She does not have any smokeless tobacco history on file. She reports that she drinks alcohol. She reports that she does not use illicit drugs.  Allergies:  Allergies  Allergen Reactions  . Lisinopril Other (See Comments)    POSSIBLE SEIZURE OR FAINTING  . Ampicillin Hives  . Mango Flavor Rash    fruit    No prescriptions prior to admission    Results for orders placed during the hospital encounter of 05/11/12 (from the past 48 hour(s))  BASIC METABOLIC PANEL     Status: Abnormal   Collection Time   05/10/12  4:10 PM      Component Value Range Comment   Sodium 134 (*) 135 - 145 mEq/L    Potassium 4.9  3.5 - 5.1 mEq/L    Chloride 96  96 - 112 mEq/L    CO2 29  19 - 32 mEq/L    Glucose, Bld 94  70 - 99 mg/dL    BUN 6  6 - 23 mg/dL    Creatinine, Ser 9.62  0.50 - 1.10 mg/dL    Calcium 9.8  8.4 - 95.2 mg/dL    GFR calc non Af Amer >90  >90 mL/min    GFR calc Af Amer >90  >90 mL/min     No results found.   A comprehensive review of systems was negative except for: Neurological: positive for seizures  There were no vitals taken for this visit.  General appearance: alert, cooperative and appears stated age Head: Normocephalic, without obvious abnormality, atraumatic Neck: supple, symmetrical, trachea midline Resp: clear to auscultation bilaterally Cardio: regular rate and rhythm GI: soft, non-tender; bowel sounds normal; no masses,  no organomegaly Extremities: light touch sensation and capillary refill intact all digits.  visible deformity at left wrist.  skin intact. Pulses: 2+ and symmetric Skin: some eccymosis Neurologic: Grossly normal Incision/Wound: na  Assessment/Plan Left distal radius fracture with angulation.  Discussed non operative and operative treatment options with patient.  She elects to undergo operative fixation.  Risks, benefits, and alternatives of surgery were discussed and the patient agrees with the plan of care.  Samuell Knoble R 05/11/2012, 8:55 AM

## 2012-05-11 NOTE — Op Note (Signed)
Dictation [id number]

## 2012-05-11 NOTE — Anesthesia Postprocedure Evaluation (Signed)
Anesthesia Post Note  Patient: Cassandra Salazar  Procedure(s) Performed: Procedure(s) (LRB): OPEN REDUCTION INTERNAL FIXATION (ORIF) DISTAL RADIAL FRACTURE (Left)  Anesthesia type: General  Patient location: PACU  Post pain: Pain level controlled  Post assessment: Patient's Cardiovascular Status Stable  Last Vitals:  Filed Vitals:   05/11/12 1445  BP:   Pulse:   Temp: 36.7 C  Resp:     Post vital signs: Reviewed and stable  Level of consciousness: alert  Complications: No apparent anesthesia complications

## 2012-05-11 NOTE — Progress Notes (Signed)
Assisted Dr. Frederick with left, ultrasound guided, supraclavicular block. Side rails up, monitors on throughout procedure. See vital signs in flow sheet. Tolerated Procedure well. 

## 2012-05-11 NOTE — Anesthesia Preprocedure Evaluation (Signed)
Anesthesia Evaluation  Patient identified by MRN, date of birth, ID band Patient awake    Reviewed: Allergy & Precautions, H&P , NPO status , Patient's Chart, lab work & pertinent test results, reviewed documented beta blocker date and time   Airway Mallampati: II TM Distance: >3 FB Neck ROM: full    Dental   Pulmonary neg pulmonary ROS,          Cardiovascular hypertension, On Medications negative cardio ROS      Neuro/Psych Seizures -, Well Controlled,  negative psych ROS   GI/Hepatic negative GI ROS, Neg liver ROS,   Endo/Other  negative endocrine ROS  Renal/GU negative Renal ROS  negative genitourinary   Musculoskeletal   Abdominal   Peds  Hematology negative hematology ROS (+)   Anesthesia Other Findings See surgeon's H&P   Reproductive/Obstetrics negative OB ROS                           Anesthesia Physical Anesthesia Plan  ASA: II  Anesthesia Plan: General   Post-op Pain Management:    Induction: Intravenous  Airway Management Planned: LMA  Additional Equipment:   Intra-op Plan:   Post-operative Plan: Extubation in OR  Informed Consent: I have reviewed the patients History and Physical, chart, labs and discussed the procedure including the risks, benefits and alternatives for the proposed anesthesia with the patient or authorized representative who has indicated his/her understanding and acceptance.   Dental Advisory Given  Plan Discussed with: CRNA and Surgeon  Anesthesia Plan Comments:         Anesthesia Quick Evaluation

## 2012-05-19 ENCOUNTER — Ambulatory Visit: Payer: Self-pay | Attending: Orthopedic Surgery | Admitting: Occupational Therapy

## 2012-05-19 DIAGNOSIS — IMO0001 Reserved for inherently not codable concepts without codable children: Secondary | ICD-10-CM | POA: Insufficient documentation

## 2012-05-19 DIAGNOSIS — M6281 Muscle weakness (generalized): Secondary | ICD-10-CM | POA: Insufficient documentation

## 2012-05-19 DIAGNOSIS — M2550 Pain in unspecified joint: Secondary | ICD-10-CM | POA: Insufficient documentation

## 2012-05-26 ENCOUNTER — Ambulatory Visit: Payer: Self-pay | Admitting: Occupational Therapy

## 2012-06-02 ENCOUNTER — Ambulatory Visit: Payer: Self-pay | Admitting: Occupational Therapy

## 2012-06-09 ENCOUNTER — Ambulatory Visit: Payer: Self-pay | Admitting: Occupational Therapy

## 2012-06-16 ENCOUNTER — Ambulatory Visit: Payer: Self-pay | Admitting: Occupational Therapy

## 2012-06-23 ENCOUNTER — Ambulatory Visit: Payer: Self-pay | Attending: Orthopedic Surgery | Admitting: Occupational Therapy

## 2012-06-23 DIAGNOSIS — M2550 Pain in unspecified joint: Secondary | ICD-10-CM | POA: Insufficient documentation

## 2012-06-23 DIAGNOSIS — M6281 Muscle weakness (generalized): Secondary | ICD-10-CM | POA: Insufficient documentation

## 2012-06-23 DIAGNOSIS — IMO0001 Reserved for inherently not codable concepts without codable children: Secondary | ICD-10-CM | POA: Insufficient documentation

## 2012-06-30 ENCOUNTER — Ambulatory Visit: Payer: Self-pay | Admitting: Occupational Therapy

## 2012-07-07 ENCOUNTER — Ambulatory Visit: Payer: Self-pay | Admitting: Occupational Therapy

## 2012-07-14 ENCOUNTER — Ambulatory Visit: Payer: Self-pay | Admitting: Occupational Therapy

## 2012-09-30 ENCOUNTER — Other Ambulatory Visit (HOSPITAL_COMMUNITY): Payer: Self-pay | Admitting: Physician Assistant

## 2012-09-30 DIAGNOSIS — Z139 Encounter for screening, unspecified: Secondary | ICD-10-CM

## 2012-11-22 ENCOUNTER — Ambulatory Visit (HOSPITAL_COMMUNITY): Payer: Self-pay

## 2013-01-29 ENCOUNTER — Encounter (HOSPITAL_COMMUNITY): Payer: Self-pay

## 2013-01-29 ENCOUNTER — Emergency Department (HOSPITAL_COMMUNITY)
Admission: EM | Admit: 2013-01-29 | Discharge: 2013-01-29 | Disposition: A | Discharge status: Home / Self Care | Payer: Self-pay | Attending: Emergency Medicine | Admitting: Emergency Medicine

## 2013-01-29 DIAGNOSIS — W57XXXA Bitten or stung by nonvenomous insect and other nonvenomous arthropods, initial encounter: Secondary | ICD-10-CM | POA: Insufficient documentation

## 2013-01-29 DIAGNOSIS — Y929 Unspecified place or not applicable: Secondary | ICD-10-CM | POA: Insufficient documentation

## 2013-01-29 DIAGNOSIS — Y939 Activity, unspecified: Secondary | ICD-10-CM | POA: Insufficient documentation

## 2013-01-29 DIAGNOSIS — Z79899 Other long term (current) drug therapy: Secondary | ICD-10-CM | POA: Insufficient documentation

## 2013-01-29 DIAGNOSIS — I10 Essential (primary) hypertension: Secondary | ICD-10-CM | POA: Insufficient documentation

## 2013-01-29 DIAGNOSIS — E7800 Pure hypercholesterolemia, unspecified: Secondary | ICD-10-CM | POA: Insufficient documentation

## 2013-01-29 DIAGNOSIS — L089 Local infection of the skin and subcutaneous tissue, unspecified: Secondary | ICD-10-CM | POA: Insufficient documentation

## 2013-01-29 DIAGNOSIS — F17200 Nicotine dependence, unspecified, uncomplicated: Secondary | ICD-10-CM | POA: Insufficient documentation

## 2013-01-29 HISTORY — DX: Pure hypercholesterolemia, unspecified: E78.00

## 2013-01-29 MED ORDER — HYDROCODONE-ACETAMINOPHEN 5-325 MG PO TABS: 1.0000 | ORAL_TABLET | ORAL | Status: AC | PRN

## 2013-01-29 MED ORDER — HYDROCODONE-ACETAMINOPHEN 5-325 MG PO TABS
2.0000 | ORAL_TABLET | Freq: Once | ORAL | Status: AC
  Administered 2013-01-29: 2 via ORAL
  Filled 2013-01-29: qty 2

## 2013-01-29 MED ORDER — DOXYCYCLINE HYCLATE 100 MG PO TABS
100.0000 mg | ORAL_TABLET | Freq: Once | ORAL | Status: AC
  Administered 2013-01-29: 100 mg via ORAL
  Filled 2013-01-29: qty 1

## 2013-01-29 MED ORDER — DOXYCYCLINE HYCLATE 100 MG PO CAPS: 100.0000 mg | ORAL_CAPSULE | Freq: Two times a day (BID) | ORAL | Status: AC

## 2013-01-29 MED ORDER — HYDROCODONE-ACETAMINOPHEN 5-325 MG PO TABS: 1.0000 | ORAL_TABLET | Freq: Once | ORAL | Status: DC

## 2013-01-29 NOTE — ED Notes (Signed)
Complains of itching on bilateral buttocks. Pt states sites are swollen, also complains of scratching off a mole located on back.

## 2013-01-30 NOTE — ED Provider Notes (Signed)
History     CSN: [id number]  Arrival date & time 01/29/13  1022   First MD Initiated Contact with Patient 01/29/13 1027      Chief Complaint  Patient presents with  . Insect Bite    (Consider location/radiation/quality/duration/timing/severity/associated sxs/prior treatment) HPI Comments: Cassandra Salazar is a 57 y.o. Female presenting with 2 tender yet itchy "bumps", one on each buttock was has been present for the past week.  She also has complaint of a tender mole on her right upper back which she believes she may have partially scratched off yesterday.  She has increasing pain and swelling at this site.  She denies fevers and chills and there has been no bleeding or drainage from the sites.  She denies a history of mrsa,  But has been exposed by both husband and sister in law.  She has taken no medications for her symptoms,  But has been cleaning the areas with hydrogen peroxide.  Pain is triggered by palpation or by sitting on the sites and is pain free when the areas are not manipulated.  There is no radiation of pain.     The history is provided by the patient.    Past Medical History  Diagnosis Date  . Hypertension   . Hypercholesteremia     Past Surgical History  Procedure Laterality Date  . No past surgeries    . Fracture surgery      Family History  Problem Relation Age of Onset  . Hypertension Father   . Alcohol abuse Father   . Hypertension Brother   . Diabetes type II Brother   . Diabetes Mother   . Kidney failure Mother     ESRD from DM  . Diabetes type II Mother   . Coronary artery disease Mother   . Diabetes Brother   . Diabetes type II Brother   . Hypertension Brother   . Diabetes Brother   . Alcohol abuse Brother   . Alcohol abuse Brother   . Alcohol abuse Brother   . Alcohol abuse Brother     History  Substance Use Topics  . Smoking status: Current Every Day Smoker -- 1.00 packs/day for 30 years    Types: Cigarettes  . Smokeless  tobacco: Not on file  . Alcohol Use: 0.0 oz/week    6-8 Cans of beer per week     Comment: beer daily    OB History   Grav Para Term Preterm Abortions TAB SAB Ect Mult Living                  Review of Systems  Constitutional: Negative for fever and chills.  HENT: Negative for facial swelling.   Respiratory: Negative for shortness of breath and wheezing.   Cardiovascular: Negative for chest pain.  Gastrointestinal: Negative for nausea and vomiting.  Skin: Positive for wound. Negative for rash.  Neurological: Negative for weakness and numbness.    Allergies  Atenolol-chlorthalidone; Lisinopril; Ampicillin; and Mango flavor  Home Medications   Current Outpatient Rx  Name  Route  Sig  Dispense  Refill  . amLODipine (NORVASC) 10 MG tablet   Oral   Take 10 mg by mouth daily.         . cloNIDine (CATAPRES) 0.1 MG tablet   Oral   Take 0.1 mg by mouth 2 (two) times daily.         Marland Kitchen doxycycline (VIBRAMYCIN) 100 MG capsule   Oral   Take 1 capsule (100  mg total) by mouth 2 (two) times daily.   19 capsule   0   . fish oil-omega-3 fatty acids 1000 MG capsule   Oral   Take 2 g by mouth daily.         Marland Kitchen HYDROcodone-acetaminophen (NORCO) 5-325 MG per tablet      1-2 tabs po q6 hours prn pain   40 tablet   0   . HYDROcodone-acetaminophen (NORCO/VICODIN) 5-325 MG per tablet   Oral   Take 1 tablet by mouth every 4 (four) hours as needed for pain.   20 tablet   0   . Multiple Vitamin (MULTIVITAMIN WITH MINERALS) TABS   Oral   Take 1 tablet by mouth daily.         Marland Kitchen oxyCODONE-acetaminophen (PERCOCET) 5-325 MG per tablet      Take 1 or 2 po Q 6hrs for pain   30 tablet   0   . Potassium 99 MG TABS   Oral   Take 1 tablet by mouth daily.           BP 162/75  Pulse 105  Temp(Src) 97.7 F (36.5 C) (Oral)  Resp 20  Ht 5\' 4"  (1.626 m)  Wt 135 lb (61.236 kg)  BMI 23.16 kg/m2  SpO2 96%  Physical Exam  Constitutional: She appears well-developed and  well-nourished. No distress.  HENT:  Head: Normocephalic.  Neck: Neck supple.  Cardiovascular: Normal rate.   Pulmonary/Chest: Effort normal. She has no wheezes.  Musculoskeletal: Normal range of motion. She exhibits no edema.  Skin: Rash noted. Rash is papular.  Two papules,  One on each outer lower buttock.  No pustule,  No surrounding erythema,  Left lesion is larger (1 cm) and more indurated than the right (0.5 cm).  "Mole" on right upper back is actually an embedded tick with surrounding erythema and slight induration.      ED Course  Procedures (including critical care time)   Procedure:  Tick removal.  Area was cleansed with alcohol swab. Tick was grasped near skin attachement with forceps and gentle upward pressure applied until tick pulled away.  Both graspers were removed with no residual remaining tick parts.  Pt tolerated well.  Labs Reviewed - No data to display No results found.   1. Infected tick bite, initial encounter   2. Skin infection       MDM  Encouraged warm compresses to buttock lesions.  Pt placed on doxycycline to cover for mrsa and also for possible tick borne infection.  No exam findings suggestive of need for I and D.  Discussed this possibility with patient if the buttock lesions do not resolve with soaks and abx.          Burgess Amor, PA-C 01/30/13 [phone]

## 2013-01-31 NOTE — ED Provider Notes (Signed)
Medical screening examination/treatment/procedure(s) were performed by non-physician practitioner and as supervising physician I was immediately available for consultation/collaboration.  Geoffery Lyons, MD 01/31/13 [phone]

## 2014-10-08 ENCOUNTER — Emergency Department (HOSPITAL_COMMUNITY)
Admission: EM | Admit: 2014-10-08 | Discharge: 2014-10-08 | Disposition: A | Discharge status: Home / Self Care | Payer: Self-pay | Attending: Emergency Medicine | Admitting: Emergency Medicine

## 2014-10-08 ENCOUNTER — Emergency Department (HOSPITAL_COMMUNITY): Payer: Self-pay

## 2014-10-08 ENCOUNTER — Encounter (HOSPITAL_COMMUNITY): Payer: Self-pay | Admitting: Emergency Medicine

## 2014-10-08 DIAGNOSIS — Z720 Tobacco use: Secondary | ICD-10-CM | POA: Insufficient documentation

## 2014-10-08 DIAGNOSIS — R0789 Other chest pain: Secondary | ICD-10-CM | POA: Insufficient documentation

## 2014-10-08 DIAGNOSIS — I10 Essential (primary) hypertension: Secondary | ICD-10-CM | POA: Insufficient documentation

## 2014-10-08 DIAGNOSIS — R079 Chest pain, unspecified: Secondary | ICD-10-CM

## 2014-10-08 DIAGNOSIS — Z8639 Personal history of other endocrine, nutritional and metabolic disease: Secondary | ICD-10-CM | POA: Insufficient documentation

## 2014-10-08 DIAGNOSIS — F4320 Adjustment disorder, unspecified: Secondary | ICD-10-CM | POA: Insufficient documentation

## 2014-10-08 DIAGNOSIS — F4321 Adjustment disorder with depressed mood: Secondary | ICD-10-CM

## 2014-10-08 DIAGNOSIS — Z880 Allergy status to penicillin: Secondary | ICD-10-CM | POA: Insufficient documentation

## 2014-10-08 LAB — CBC WITH DIFFERENTIAL/PLATELET
Basophils Absolute: 0.1 10*3/uL (ref 0.0–0.1)
Basophils Relative: 1 % (ref 0–1)
EOS ABS: 0.1 10*3/uL (ref 0.0–0.7)
EOS PCT: 1 % (ref 0–5)
HEMATOCRIT: 43.2 % (ref 36.0–46.0)
HEMOGLOBIN: 15.3 g/dL — AB (ref 12.0–15.0)
LYMPHS ABS: 2.8 10*3/uL (ref 0.7–4.0)
Lymphocytes Relative: 38 % (ref 12–46)
MCH: 33.8 pg (ref 26.0–34.0)
MCHC: 35.4 g/dL (ref 30.0–36.0)
MCV: 95.6 fL (ref 78.0–100.0)
MONO ABS: 0.7 10*3/uL (ref 0.1–1.0)
MONOS PCT: 10 % (ref 3–12)
NEUTROS PCT: 50 % (ref 43–77)
Neutro Abs: 3.7 10*3/uL (ref 1.7–7.7)
Platelets: 294 10*3/uL (ref 150–400)
RBC: 4.52 MIL/uL (ref 3.87–5.11)
RDW: 12.5 % (ref 11.5–15.5)
WBC: 7.5 10*3/uL (ref 4.0–10.5)

## 2014-10-08 LAB — BASIC METABOLIC PANEL
Anion gap: 16 — ABNORMAL HIGH (ref 5–15)
BUN: 6 mg/dL (ref 6–23)
CO2: 25 mEq/L (ref 19–32)
Calcium: 9.6 mg/dL (ref 8.4–10.5)
Chloride: 90 mEq/L — ABNORMAL LOW (ref 96–112)
Creatinine, Ser: 0.59 mg/dL (ref 0.50–1.10)
GFR calc Af Amer: >90 mL/min (ref >90)
GLUCOSE: 116 mg/dL — AB (ref 70–99)
POTASSIUM: 3.9 meq/L (ref 3.7–5.3)
Sodium: 131 mEq/L — ABNORMAL LOW (ref 137–147)

## 2014-10-08 LAB — TROPONIN I: Troponin I: <0.3 ng/mL (ref <0.30)

## 2014-10-08 MED ORDER — NAPROXEN 250 MG PO TABS
500.0000 mg | ORAL_TABLET | Freq: Once | ORAL | Status: AC
  Administered 2014-10-08: 500 mg via ORAL
  Filled 2014-10-08: qty 2

## 2014-10-08 MED ORDER — GI COCKTAIL ~~LOC~~
30.0000 mL | Freq: Once | ORAL | Status: AC
  Administered 2014-10-08: 30 mL via ORAL
  Filled 2014-10-08: qty 30

## 2014-10-08 MED ORDER — FAMOTIDINE 20 MG PO TABS
20.0000 mg | ORAL_TABLET | Freq: Once | ORAL | Status: AC
  Administered 2014-10-08: 20 mg via ORAL
  Filled 2014-10-08: qty 1

## 2014-10-08 NOTE — Discharge Instructions (Signed)
[facility] [place] in the next 1-2 weeks. If you feel like you want to hurt yourself or anyone else go to the nearest ED or call 911.

## 2014-10-08 NOTE — ED Notes (Signed)
Advised RN of elevated PB

## 2014-10-08 NOTE — Progress Notes (Signed)
ED/CM noted patient did not have health insurance and/or PCP listed in the computer.  Patient was given the [place] resources handout with information on the clinics, food pantries, and the handout for new health insurance sign-up. Provided drug discount card. Patient expressed appreciation for information received.   

## 2014-10-08 NOTE — ED Notes (Signed)
Patient c/o mid-sternal to left side chest pain/burning that started this morning. Denies any shortness of breath. Per patient has been under a lot of stress recently (husband died of MI this past month and father passed 2 months ago). Patient also in process of moving which was to start today.

## 2014-10-08 NOTE — ED Provider Notes (Signed)
CSN: [id number]     Arrival date & time 10/08/14  [id number] History   First MD Initiated Contact with Patient 10/08/14 0934    This chart was scribed for No att. providers found by Marica OtterNusrat Rahman, ED Scribe. This patient was seen in room APA09/APA09 and the patient's care was started at 10:15 AM.  Chief Complaint  Patient presents with  . Chest Pain   The history is provided by the patient. No language interpreter was used.   PCP: No PCP Per Patient HPI Comments: Cassandra Salazar is a 58 y.o. female, with medical Hx noted below and significant for HTN,  who presents to the Emergency Department complaining of atraumatic, recurrent, waxing and waning, left-sided, mid sternal chest pain with burning sensation onset this morning. Pt reports that the chest pain is more of an aching sensation consistent with Sx experienced during indigestion. Pt rates her current pain 1/10. Pt reports that walking and when she is "doing something" it alleviates her Sx. She states thinking about what has happened lately or coming to [place] makes the pain worse. If she goes to [place], the pain goes away.   Pt reports an elevated level of stress recently due to the death of her husband last month suddenly and unexpectantly and her father 3 months ago and subsequent relocation (pt reports she is supposed to move today). Pt also notes that her dog that her husband rescued a few months ago started becoming aggressive this past week which caused her fear. They have sent the dog back to the rescue place.  Pt notes that she has been living in another town recently with her husband's family and reports that she experiences similar pains every time she returns to her house in [place].   Pt notes a family Hx of heart problems on her paternal side, however, notes her father did not have a Hx of any heart problems.   Pt also complains of associated productive cough with clear sputum onset one week ago. Pt notes the cough is worse  in the morning and notes she also has associated wheezing in the morning.   Pt denies any hx of treatment of depression. Pt notes that she has felt depressed. Pt denies SI, HI.  Her Brother who is in the room states all her siblings have been treated for depression.   Pt denies SOB, n/v, fever, rhinorrhea.    Pt returned to [place] today and has packed a [employer] and are ready to move completely to [place] but she has had the chest pain since 4:30 this morning and wanted an evaluation. The pains started about 1 month ago.   [facility]  Past Medical History  Diagnosis Date  . Hypertension   . Hypercholesteremia    Past Surgical History  Procedure Laterality Date  . Fracture surgery     Family History  Problem Relation Age of Onset  . Hypertension Father   . Alcohol abuse Father   . Hypertension Brother   . Diabetes type II Brother   . Diabetes Mother   . Kidney failure Mother     ESRD from DM  . Diabetes type II Mother   . Coronary artery disease Mother   . Diabetes Brother   . Diabetes type II Brother   . Hypertension Brother   . Diabetes Brother   . Alcohol abuse Brother   . Alcohol abuse Brother   . Alcohol abuse Brother   . Alcohol abuse Brother  History  Substance Use Topics  . Smoking status: Current Every Day Smoker -- 1.00 packs/day for 30 years    Types: Cigarettes  . Smokeless tobacco: Never Used  . Alcohol Use: 0.0 oz/week    6-8 Cans of beer per week     Comment: beer daily   Will be living with her husbands family in [place] History    Gravida Para Term Preterm AB TAB SAB Ectopic Multiple Living   1 1 1       1      Review of Systems  Constitutional: Negative for fever and chills.  HENT: Negative for rhinorrhea.   Respiratory: Positive for cough and wheezing. Negative for shortness of breath.   Cardiovascular: Positive for chest pain.  Gastrointestinal: Negative for nausea and vomiting.  Psychiatric/Behavioral: Positive for  dysphoric mood. Negative for suicidal ideas and confusion.  All other systems reviewed and are negative.  Allergies  Atenolol-chlorthalidone; Lisinopril; Ampicillin; and Mango flavor  Home Medications   Prior to Admission medications   Medication Sig Start Date End Date Taking? Authorizing Provider  doxycycline (VIBRAMYCIN) 100 MG capsule Take 1 capsule (100 mg total) by mouth 2 (two) times daily. Patient not taking: Reported on 10/08/2014 01/29/13   Burgess Amor, PA-C  HYDROcodone-acetaminophen [facility]) 5-325 MG per tablet 1-2 tabs po q6 hours prn pain Patient not taking: Reported on 10/08/2014 05/11/12   Betha Loa, MD  HYDROcodone-acetaminophen (NORCO/VICODIN) 5-325 MG per tablet Take 1 tablet by mouth every 4 (four) hours as needed for pain. Patient not taking: Reported on 10/08/2014 01/29/13   Burgess Amor, PA-C  oxyCODONE-acetaminophen (PERCOCET) 5-325 MG per tablet Take 1 or 2 po Q 6hrs for pain Patient not taking: Reported on 10/08/2014 05/08/12   Ward Givens, MD   Triage Vitals: BP 196/95 mmHg  Pulse 101  Temp(Src) 98.1 F (36.7 C) (Oral)  Resp 18  Ht 5\' 4"  (1.626 m)  Wt 135 lb (61.236 kg)  BMI 23.16 kg/m2  SpO2 99%  Vital signs normal except mild tachycardia and hypertension (son states the whole family has "white coat syndrome")  Physical Exam  Constitutional: She is oriented to person, place, and time. She appears well-developed and well-nourished.  Non-toxic appearance. She does not appear ill. No distress.  Getting history is difficult to obtain, pt has difficulty communicating and expressing herself.   HENT:  Head: Normocephalic and atraumatic.  Right Ear: External ear normal.  Left Ear: External ear normal.  Nose: Nose normal. No mucosal edema or rhinorrhea.  Mouth/Throat: Oropharynx is clear and moist and mucous membranes are normal. No dental abscesses or uvula swelling.  Eyes: Conjunctivae and EOM are normal. Pupils are equal, round, and reactive to light.  Neck:  Normal range of motion and full passive range of motion without pain. Neck supple.  Cardiovascular: Normal rate, regular rhythm and normal heart sounds.  Exam reveals no gallop and no friction rub.   No murmur heard. Pulmonary/Chest: Effort normal and breath sounds normal. No respiratory distress. She has no wheezes. She has no rhonchi. She has no rales. She exhibits tenderness. She exhibits no crepitus.    Mild lower, central chest wall pain  Abdominal: Soft. Normal appearance and bowel sounds are normal. She exhibits no distension. There is no tenderness. There is no rebound and no guarding.  Musculoskeletal: Normal range of motion. She exhibits no edema or tenderness.  Moves all extremities well.   Neurological: She is alert and oriented to person, place, and time. She  has normal strength. No cranial nerve deficit.  Skin: Skin is warm, dry and intact. No rash noted. No erythema. No pallor.  Psychiatric: Her speech is normal and behavior is normal. Her mood appears not anxious.  Seems anxious   Nursing note and vitals reviewed.   ED Course  Procedures (including critical care time) DIAGNOSTIC STUDIES: Oxygen Saturation is 99% on RA, nl by my interpretation.    COORDINATION OF CARE: 10:27 AM-Discussed treatment plan which includes EKG, labs, and medsresults with pt at bedside and pt agreed to plan.   11:46 AM: Recheck: Pt continues to complain of chest discomfort. Discussed lab results with pt. Her [employer] is packed and they are ready to leave. Patient states when she leaves [place] her pain goes away so patient will be discharged. We discussed following up with her local mental health facility in [place] to get treatment for grief.  Labs Review Results for orders placed or performed during the hospital encounter of 10/08/14  Troponin I  Result Value Ref Range   Troponin I <0.30 <0.30 ng/mL  CBC with Differential  Result Value Ref Range   WBC 7.5 4.0 - 10.5 K/uL   RBC 4.52 3.87  - 5.11 MIL/uL   Hemoglobin 15.3 (H) 12.0 - 15.0 g/dL   HCT 32.443.2 40.136.0 - 02.746.0 %   MCV 95.6 78.0 - 100.0 fL   MCH 33.8 26.0 - 34.0 pg   MCHC 35.4 30.0 - 36.0 g/dL   RDW 25.312.5 66.411.5 - 40.315.5 %   Platelets 294 150 - 400 K/uL   Neutrophils Relative % 50 43 - 77 %   Neutro Abs 3.7 1.7 - 7.7 K/uL   Lymphocytes Relative 38 12 - 46 %   Lymphs Abs 2.8 0.7 - 4.0 K/uL   Monocytes Relative 10 3 - 12 %   Monocytes Absolute 0.7 0.1 - 1.0 K/uL   Eosinophils Relative 1 0 - 5 %   Eosinophils Absolute 0.1 0.0 - 0.7 K/uL   Basophils Relative 1 0 - 1 %   Basophils Absolute 0.1 0.0 - 0.1 K/uL  Basic metabolic panel  Result Value Ref Range   Sodium 131 (L) 137 - 147 mEq/L   Potassium 3.9 3.7 - 5.3 mEq/L   Chloride 90 (L) 96 - 112 mEq/L   CO2 25 19 - 32 mEq/L   Glucose, Bld 116 (H) 70 - 99 mg/dL   BUN 6 6 - 23 mg/dL   Creatinine, Ser 4.740.59 0.50 - 1.10 mg/dL   Calcium 9.6 8.4 - 25.910.5 mg/dL   GFR calc non Af Amer >90 >90 mL/min   GFR calc Af Amer >90 >90 mL/min   Anion gap 16 (H) 5 - 15   Laboratory interpretation all normal with normal troponin after 6 hours of chest pain, mild hyponatremia and low chloride with elevated hemoglobin consistent with mild dehydration   Imaging Review Dg Chest 2 View  10/08/2014   CLINICAL DATA:  Chest burning, chest pain, hypertension.  EXAM: CHEST  2 VIEW  COMPARISON:  10/01/2011  FINDINGS: The heart size and mediastinal contours are within normal limits. Both lungs are clear. The visualized skeletal structures are unremarkable.  IMPRESSION: No active cardiopulmonary disease.   Electronically Signed   By: Charlett NoseKevin  Dover M.D.   On: 10/08/2014 11:48     EKG Interpretation   Date/Time:  Sunday October 08 2014 09:33:12 EST Ventricular Rate:  105 PR Interval:  165 QRS Duration: 97 QT Interval:  364 QTC Calculation: 481 R Axis:   -  99 Text Interpretation:  Sinus tachycardia Probable left atrial enlargement  Left anterior fascicular block Since last tracing rate faster 01 Oct 2011  Confirmed by [facility]  MD-I, Shaniece Bussa ([id number]) on 10/08/2014 9:46:12 AM      MDM   Final diagnoses:  Chest pain  Atypical chest pain  Grief reaction   Plan discharge  Devoria Albe, MD, FACEP    I personally performed the services described in this documentation, which was scribed in my presence. The recorded information has been reviewed and considered.  Devoria Albe, MD, Armando Gang    Ward Givens, MD 10/08/14 [phone]

## 2016-05-12 IMAGING — CR DG CHEST 2V
2 series · 2 of 2 positions shown · non-contrast
Comparison: 10/01/2011

CLINICAL DATA: Chest burning, chest pain, hypertension.

EXAM:
CHEST  2 VIEW

[view not recorded (1 of 2)]
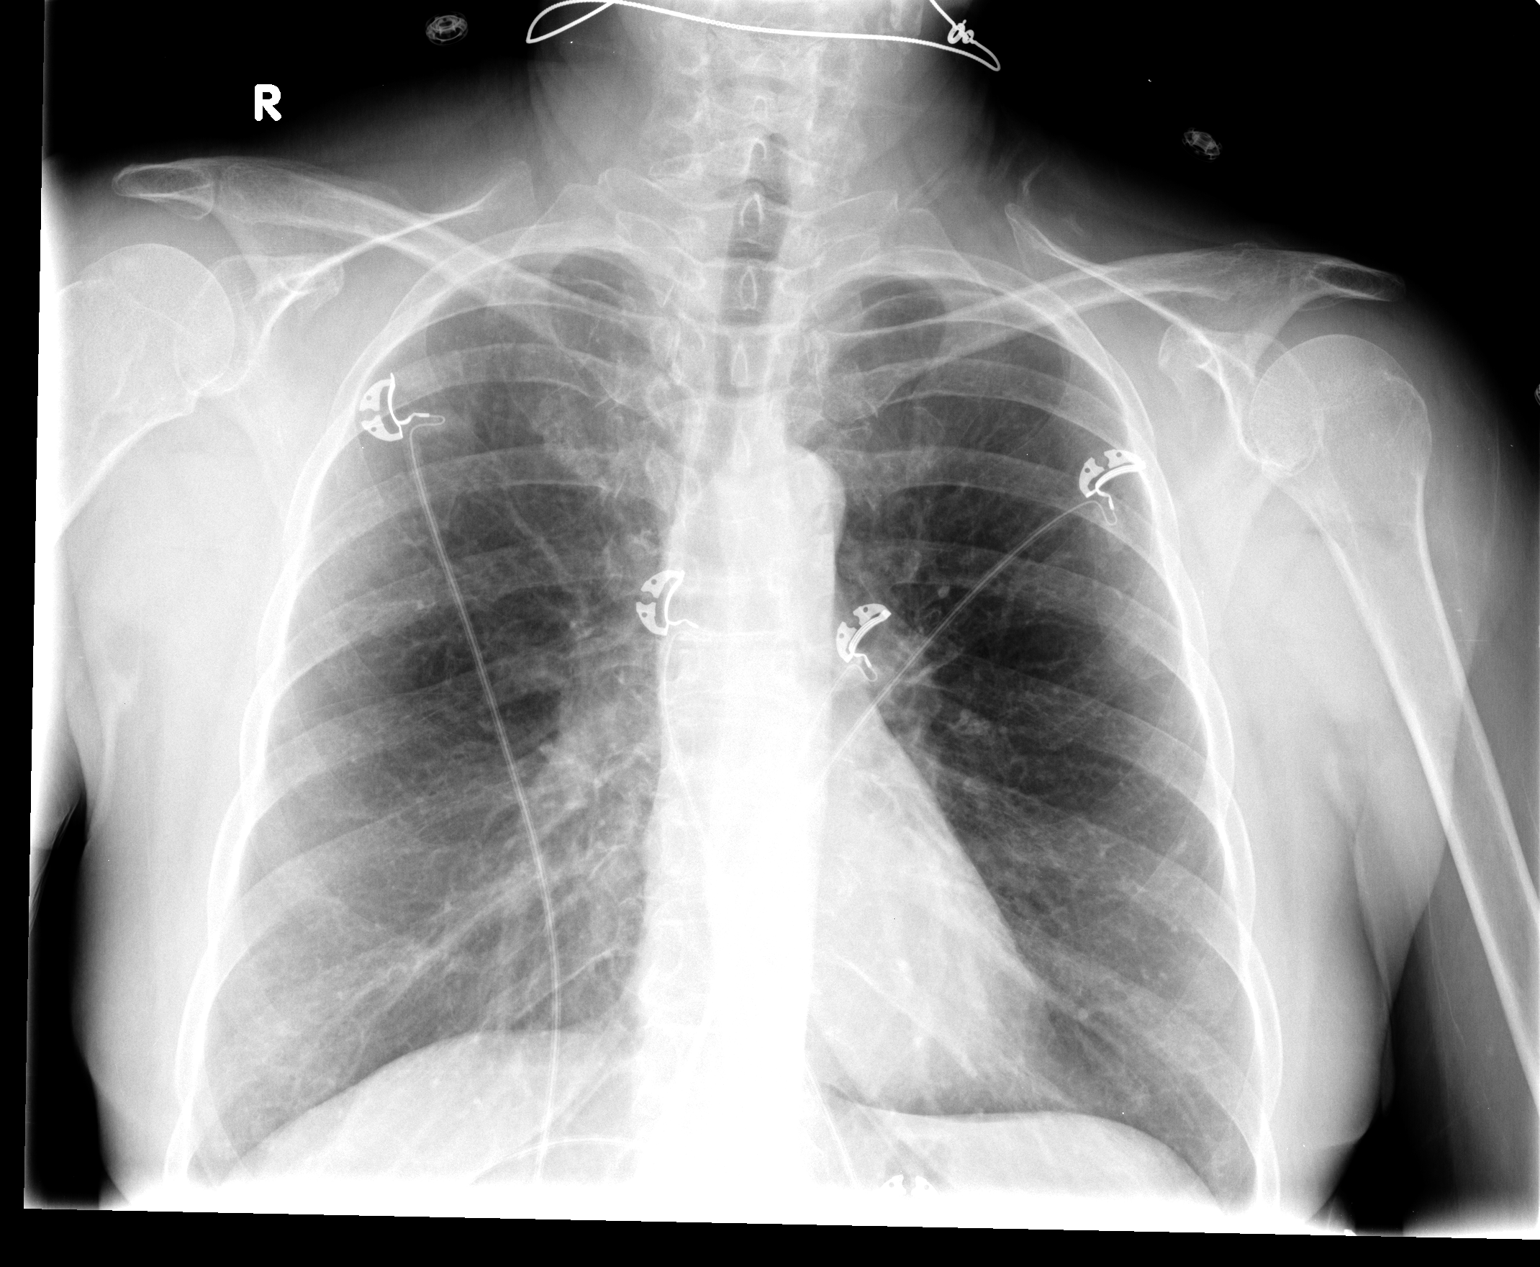

[view not recorded (2 of 2)]
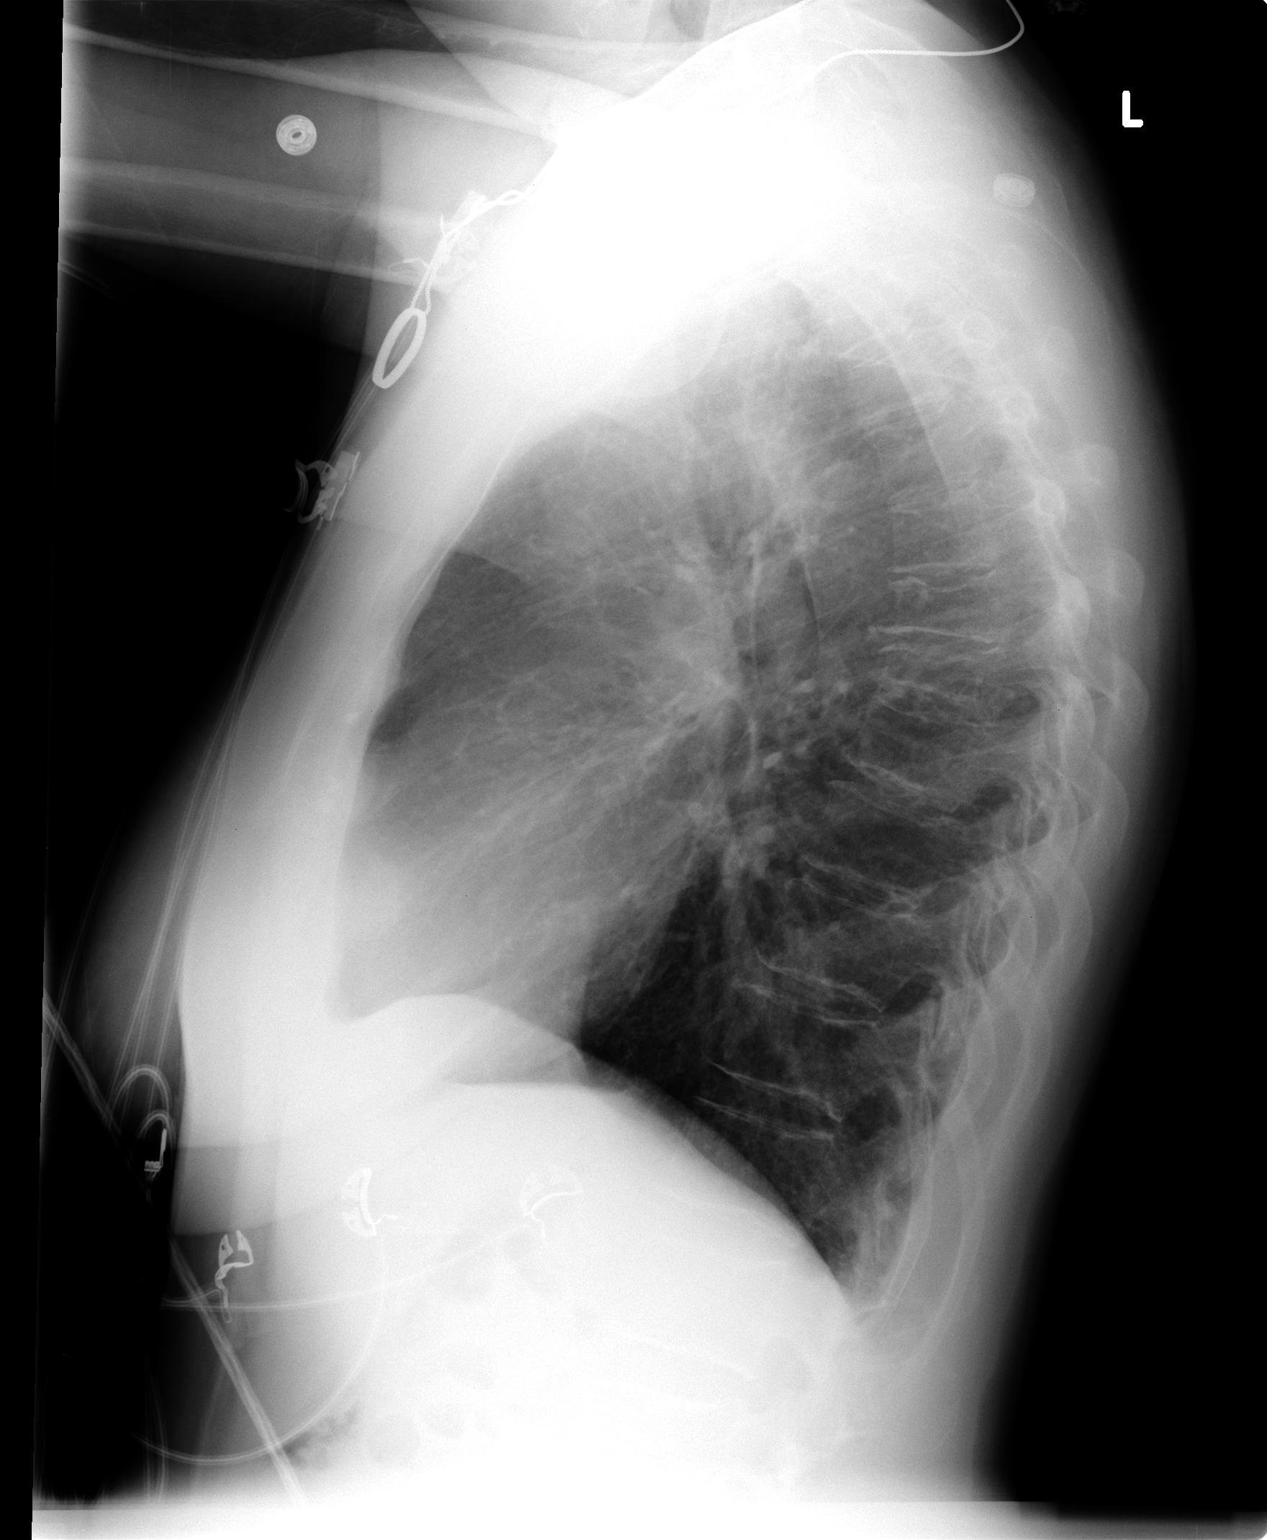

[2 of 2 positions shown; findings below may reference images not displayed]

FINDINGS: The heart size and mediastinal contours are within normal limits.
Both lungs are clear. The visualized skeletal structures are
unremarkable.
IMPRESSION: No active cardiopulmonary disease.
